# Patient Record
Sex: Male | Born: 1940 | Race: White | Hispanic: No | Marital: Married | Smoking: Former smoker
Health system: Southern US, Community
[De-identification: ages and names within clinical notes are randomized; demographics above are authoritative.]

## PROBLEM LIST (undated history)

## (undated) DIAGNOSIS — J90 Pleural effusion, not elsewhere classified: Secondary | ICD-10-CM

## (undated) DIAGNOSIS — G4730 Sleep apnea, unspecified: Secondary | ICD-10-CM

## (undated) DIAGNOSIS — E669 Obesity, unspecified: Secondary | ICD-10-CM

## (undated) DIAGNOSIS — E7800 Pure hypercholesterolemia, unspecified: Secondary | ICD-10-CM

## (undated) DIAGNOSIS — I10 Essential (primary) hypertension: Secondary | ICD-10-CM

## (undated) DIAGNOSIS — R400 Somnolence: Secondary | ICD-10-CM

## (undated) DIAGNOSIS — I639 Cerebral infarction, unspecified: Secondary | ICD-10-CM

## (undated) DIAGNOSIS — E079 Disorder of thyroid, unspecified: Secondary | ICD-10-CM

## (undated) DIAGNOSIS — G4700 Insomnia, unspecified: Secondary | ICD-10-CM

## (undated) DIAGNOSIS — I4891 Unspecified atrial fibrillation: Secondary | ICD-10-CM

## (undated) DIAGNOSIS — I517 Cardiomegaly: Secondary | ICD-10-CM

## (undated) DIAGNOSIS — J342 Deviated nasal septum: Secondary | ICD-10-CM

## (undated) DIAGNOSIS — I495 Sick sinus syndrome: Secondary | ICD-10-CM

## (undated) HISTORY — DX: Pure hypercholesterolemia, unspecified: E78.00

## (undated) HISTORY — DX: Sleep apnea, unspecified: G47.30

## (undated) HISTORY — DX: Unspecified atrial fibrillation: I48.91

## (undated) HISTORY — DX: Cerebral infarction, unspecified: I63.9

## (undated) HISTORY — DX: Essential (primary) hypertension: I10

## (undated) HISTORY — DX: Somnolence: R40.0

## (undated) HISTORY — DX: Disorder of thyroid, unspecified: E07.9

## (undated) HISTORY — DX: Insomnia, unspecified: G47.00

## (undated) HISTORY — DX: Obesity, unspecified: E66.9

## (undated) HISTORY — DX: Cardiomegaly: I51.7

## (undated) HISTORY — DX: Sick sinus syndrome: I49.5

## (undated) HISTORY — DX: Deviated nasal septum: J34.2

## (undated) HISTORY — PX: CAROTID ARTERY - SUBCLAVIAN ARTERY BYPASS GRAFT: SUR178

## (undated) HISTORY — PX: OTHER SURGICAL HISTORY: SHX169

## (undated) HISTORY — DX: Pleural effusion, not elsewhere classified: J90

---

## 2009-07-22 ENCOUNTER — Encounter: Payer: Self-pay | Admitting: Cardiovascular Disease

## 2009-07-26 ENCOUNTER — Encounter: Payer: Self-pay | Admitting: Cardiovascular Disease

## 2009-08-16 ENCOUNTER — Encounter: Payer: Self-pay | Admitting: Cardiovascular Disease

## 2009-08-23 ENCOUNTER — Encounter: Payer: Self-pay | Admitting: Cardiovascular Disease

## 2009-09-20 ENCOUNTER — Encounter: Payer: Self-pay | Admitting: Cardiovascular Disease

## 2009-09-22 ENCOUNTER — Encounter: Payer: Self-pay | Admitting: Cardiovascular Disease

## 2009-09-23 ENCOUNTER — Encounter: Payer: Self-pay | Admitting: Cardiovascular Disease

## 2009-09-27 ENCOUNTER — Encounter: Payer: Self-pay | Admitting: Cardiovascular Disease

## 2009-10-05 DIAGNOSIS — E7800 Pure hypercholesterolemia, unspecified: Secondary | ICD-10-CM

## 2009-10-05 DIAGNOSIS — R404 Transient alteration of awareness: Secondary | ICD-10-CM

## 2009-10-05 DIAGNOSIS — I10 Essential (primary) hypertension: Secondary | ICD-10-CM | POA: Insufficient documentation

## 2009-10-05 DIAGNOSIS — I6529 Occlusion and stenosis of unspecified carotid artery: Secondary | ICD-10-CM

## 2009-10-05 DIAGNOSIS — R5381 Other malaise: Secondary | ICD-10-CM

## 2009-10-05 DIAGNOSIS — G4700 Insomnia, unspecified: Secondary | ICD-10-CM

## 2009-10-05 DIAGNOSIS — R7989 Other specified abnormal findings of blood chemistry: Secondary | ICD-10-CM | POA: Insufficient documentation

## 2009-10-05 DIAGNOSIS — E119 Type 2 diabetes mellitus without complications: Secondary | ICD-10-CM | POA: Insufficient documentation

## 2009-10-05 DIAGNOSIS — I4891 Unspecified atrial fibrillation: Secondary | ICD-10-CM | POA: Insufficient documentation

## 2009-10-05 DIAGNOSIS — Z8679 Personal history of other diseases of the circulatory system: Secondary | ICD-10-CM | POA: Insufficient documentation

## 2009-10-05 DIAGNOSIS — R5383 Other fatigue: Secondary | ICD-10-CM

## 2009-10-05 DIAGNOSIS — E669 Obesity, unspecified: Secondary | ICD-10-CM | POA: Insufficient documentation

## 2009-10-06 ENCOUNTER — Ambulatory Visit: Payer: Self-pay | Admitting: Cardiovascular Disease

## 2009-10-06 DIAGNOSIS — R609 Edema, unspecified: Secondary | ICD-10-CM

## 2009-10-07 ENCOUNTER — Encounter: Payer: Self-pay | Admitting: Cardiovascular Disease

## 2009-10-10 ENCOUNTER — Encounter: Payer: Self-pay | Admitting: Cardiovascular Disease

## 2009-10-10 ENCOUNTER — Ambulatory Visit (HOSPITAL_COMMUNITY): Admission: RE | Admit: 2009-10-10 | Discharge: 2009-10-10 | Payer: Self-pay | Admitting: Cardiovascular Disease

## 2009-10-10 ENCOUNTER — Ambulatory Visit: Payer: Self-pay | Admitting: Cardiology

## 2009-10-10 ENCOUNTER — Ambulatory Visit: Payer: Self-pay

## 2009-10-12 ENCOUNTER — Ambulatory Visit (HOSPITAL_COMMUNITY): Admission: RE | Admit: 2009-10-12 | Discharge: 2009-10-12 | Payer: Self-pay | Admitting: Cardiovascular Disease

## 2009-10-12 ENCOUNTER — Ambulatory Visit: Payer: Self-pay | Admitting: Cardiovascular Disease

## 2009-10-18 ENCOUNTER — Encounter: Payer: Self-pay | Admitting: Cardiovascular Disease

## 2009-11-21 ENCOUNTER — Encounter: Payer: Self-pay | Admitting: Cardiovascular Disease

## 2009-11-21 ENCOUNTER — Encounter (HOSPITAL_COMMUNITY): Admission: RE | Admit: 2009-11-21 | Discharge: 2010-01-10 | Payer: Self-pay | Admitting: Cardiovascular Disease

## 2009-11-21 ENCOUNTER — Ambulatory Visit: Payer: Self-pay | Admitting: Cardiovascular Disease

## 2009-11-21 ENCOUNTER — Ambulatory Visit: Payer: Self-pay

## 2010-02-01 ENCOUNTER — Encounter: Payer: Self-pay | Admitting: Cardiovascular Disease

## 2010-02-01 ENCOUNTER — Ambulatory Visit: Payer: Self-pay | Admitting: Cardiovascular Disease

## 2010-02-01 DIAGNOSIS — R0602 Shortness of breath: Secondary | ICD-10-CM

## 2010-02-03 ENCOUNTER — Telehealth: Payer: Self-pay | Admitting: Cardiovascular Disease

## 2010-02-14 ENCOUNTER — Ambulatory Visit: Payer: Self-pay

## 2010-02-14 ENCOUNTER — Ambulatory Visit: Payer: Self-pay | Admitting: Cardiovascular Disease

## 2010-03-28 ENCOUNTER — Ambulatory Visit: Payer: Self-pay | Admitting: Cardiovascular Disease

## 2010-03-28 DIAGNOSIS — M542 Cervicalgia: Secondary | ICD-10-CM | POA: Insufficient documentation

## 2010-03-29 ENCOUNTER — Encounter: Payer: Self-pay | Admitting: Cardiovascular Disease

## 2010-03-30 ENCOUNTER — Telehealth (INDEPENDENT_AMBULATORY_CARE_PROVIDER_SITE_OTHER): Payer: Self-pay | Admitting: *Deleted

## 2010-04-11 ENCOUNTER — Ambulatory Visit: Payer: Self-pay | Admitting: Cardiovascular Disease

## 2010-04-12 ENCOUNTER — Ambulatory Visit: Payer: Self-pay | Admitting: Cardiology

## 2010-04-12 ENCOUNTER — Ambulatory Visit: Payer: Self-pay

## 2010-04-12 ENCOUNTER — Ambulatory Visit: Payer: Self-pay | Admitting: Cardiovascular Disease

## 2010-04-12 LAB — CONVERTED CEMR LAB
BUN: 27 mg/dL — ABNORMAL HIGH (ref 6–23)
Basophils Absolute: 0.1 10*3/uL (ref 0.0–0.1)
Basophils Relative: 0.9 % (ref 0.0–3.0)
CO2: 30 meq/L (ref 19–32)
Calcium: 9 mg/dL (ref 8.4–10.5)
Chloride: 102 meq/L (ref 96–112)
Creatinine, Ser: 1.4 mg/dL (ref 0.4–1.5)
Eosinophils Absolute: 0.2 10*3/uL (ref 0.0–0.7)
Eosinophils Relative: 2.5 % (ref 0.0–5.0)
GFR calc non Af Amer: 53.56 mL/min (ref >60)
Glucose, Bld: 153 mg/dL — ABNORMAL HIGH (ref 70–99)
HCT: 43 % (ref 39.0–52.0)
Hemoglobin: 15.1 g/dL (ref 13.0–17.0)
INR: 2.5 — ABNORMAL HIGH (ref 0.8–1.0)
Lymphocytes Relative: 13.7 % (ref 12.0–46.0)
Lymphs Abs: 1 10*3/uL (ref 0.7–4.0)
MCHC: 35.1 g/dL (ref 30.0–36.0)
MCV: 93.3 fL (ref 78.0–100.0)
Monocytes Absolute: 0.6 10*3/uL (ref 0.1–1.0)
Monocytes Relative: 8.3 % (ref 3.0–12.0)
Neutro Abs: 5.5 10*3/uL (ref 1.4–7.7)
Neutrophils Relative %: 74.6 % (ref 43.0–77.0)
Platelets: 219 10*3/uL (ref 150.0–400.0)
Potassium: 4.1 meq/L (ref 3.5–5.1)
Prothrombin Time: 27.1 s — ABNORMAL HIGH (ref 9.7–11.8)
RBC: 4.61 M/uL (ref 4.22–5.81)
RDW: 14.3 % (ref 11.5–14.6)
Sodium: 140 meq/L (ref 135–145)
WBC: 7.3 10*3/uL (ref 4.5–10.5)
aPTT: 38.8 s — ABNORMAL HIGH (ref 21.7–28.8)

## 2010-04-18 ENCOUNTER — Ambulatory Visit: Payer: Self-pay | Admitting: Cardiovascular Disease

## 2010-04-18 ENCOUNTER — Ambulatory Visit (HOSPITAL_COMMUNITY): Admission: RE | Admit: 2010-04-18 | Discharge: 2010-04-18 | Payer: Self-pay | Admitting: Cardiovascular Disease

## 2010-04-19 ENCOUNTER — Inpatient Hospital Stay (HOSPITAL_COMMUNITY): Admission: EM | Admit: 2010-04-19 | Discharge: 2010-04-22 | Payer: Self-pay | Admitting: Internal Medicine

## 2010-04-19 ENCOUNTER — Telehealth: Payer: Self-pay | Admitting: Cardiovascular Disease

## 2010-04-19 ENCOUNTER — Encounter: Payer: Self-pay | Admitting: Emergency Medicine

## 2010-04-19 ENCOUNTER — Ambulatory Visit: Payer: Self-pay | Admitting: Diagnostic Radiology

## 2010-04-21 ENCOUNTER — Ambulatory Visit: Payer: Self-pay | Admitting: Surgery

## 2010-04-21 ENCOUNTER — Ambulatory Visit: Payer: Self-pay | Admitting: Internal Medicine

## 2010-04-21 ENCOUNTER — Encounter (INDEPENDENT_AMBULATORY_CARE_PROVIDER_SITE_OTHER): Payer: Self-pay | Admitting: Internal Medicine

## 2010-04-25 ENCOUNTER — Ambulatory Visit (HOSPITAL_BASED_OUTPATIENT_CLINIC_OR_DEPARTMENT_OTHER): Admission: RE | Admit: 2010-04-25 | Discharge: 2010-04-25 | Payer: Self-pay | Admitting: Internal Medicine

## 2010-04-25 ENCOUNTER — Ambulatory Visit: Payer: Self-pay | Admitting: Adult Health

## 2010-04-25 ENCOUNTER — Ambulatory Visit: Payer: Self-pay | Admitting: Radiology

## 2010-04-25 DIAGNOSIS — J90 Pleural effusion, not elsewhere classified: Secondary | ICD-10-CM | POA: Insufficient documentation

## 2010-04-27 ENCOUNTER — Telehealth: Payer: Self-pay | Admitting: Cardiovascular Disease

## 2010-04-28 ENCOUNTER — Ambulatory Visit: Payer: Self-pay | Admitting: Internal Medicine

## 2010-05-03 ENCOUNTER — Ambulatory Visit: Payer: Self-pay | Admitting: Thoracic Surgery

## 2010-05-08 ENCOUNTER — Encounter: Payer: Self-pay | Admitting: Cardiovascular Disease

## 2010-05-09 ENCOUNTER — Ambulatory Visit: Payer: Self-pay | Admitting: Cardiovascular Disease

## 2010-05-10 ENCOUNTER — Telehealth: Payer: Self-pay | Admitting: Cardiovascular Disease

## 2010-05-15 ENCOUNTER — Encounter: Payer: Self-pay | Admitting: Thoracic Surgery

## 2010-05-15 ENCOUNTER — Inpatient Hospital Stay (HOSPITAL_COMMUNITY)
Admission: RE | Admit: 2010-05-15 | Discharge: 2010-05-19 | Payer: Self-pay | Source: Home / Self Care | Attending: Thoracic Surgery | Admitting: Thoracic Surgery

## 2010-05-22 ENCOUNTER — Ambulatory Visit: Payer: Self-pay | Admitting: Internal Medicine

## 2010-05-24 ENCOUNTER — Ambulatory Visit: Payer: Self-pay | Admitting: Thoracic Surgery

## 2010-05-24 ENCOUNTER — Encounter: Payer: Self-pay | Admitting: Cardiovascular Disease

## 2010-05-24 ENCOUNTER — Encounter
Admission: RE | Admit: 2010-05-24 | Discharge: 2010-05-24 | Payer: Self-pay | Source: Home / Self Care | Attending: Thoracic Surgery | Admitting: Thoracic Surgery

## 2010-05-24 ENCOUNTER — Telehealth: Payer: Self-pay | Admitting: Cardiovascular Disease

## 2010-05-30 ENCOUNTER — Ambulatory Visit: Payer: Self-pay | Admitting: Pulmonary Disease

## 2010-05-31 ENCOUNTER — Encounter: Payer: Self-pay | Admitting: Cardiovascular Disease

## 2010-05-31 ENCOUNTER — Telehealth: Payer: Self-pay | Admitting: Pulmonary Disease

## 2010-05-31 ENCOUNTER — Ambulatory Visit: Payer: Self-pay | Admitting: Cardiovascular Disease

## 2010-06-01 ENCOUNTER — Telehealth: Payer: Self-pay | Admitting: Cardiovascular Disease

## 2010-06-02 ENCOUNTER — Telehealth: Payer: Self-pay | Admitting: Cardiovascular Disease

## 2010-06-07 ENCOUNTER — Encounter
Admission: RE | Admit: 2010-06-07 | Discharge: 2010-06-07 | Payer: Self-pay | Source: Home / Self Care | Attending: Thoracic Surgery | Admitting: Thoracic Surgery

## 2010-06-07 ENCOUNTER — Ambulatory Visit: Payer: Self-pay | Admitting: Thoracic Surgery

## 2010-06-07 ENCOUNTER — Encounter: Payer: Self-pay | Admitting: Internal Medicine

## 2010-06-08 ENCOUNTER — Telehealth: Payer: Self-pay | Admitting: Cardiovascular Disease

## 2010-06-13 ENCOUNTER — Telehealth: Payer: Self-pay | Admitting: Cardiovascular Disease

## 2010-06-19 ENCOUNTER — Telehealth: Payer: Self-pay | Admitting: Cardiovascular Disease

## 2010-06-20 ENCOUNTER — Encounter: Payer: Self-pay | Admitting: Cardiovascular Disease

## 2010-06-22 ENCOUNTER — Ambulatory Visit
Admission: RE | Admit: 2010-06-22 | Discharge: 2010-06-22 | Payer: Self-pay | Source: Home / Self Care | Attending: Cardiovascular Disease | Admitting: Cardiovascular Disease

## 2010-07-03 ENCOUNTER — Telehealth: Payer: Self-pay | Admitting: Cardiovascular Disease

## 2010-07-04 ENCOUNTER — Ambulatory Visit
Admission: RE | Admit: 2010-07-04 | Discharge: 2010-07-04 | Payer: Self-pay | Source: Home / Self Care | Attending: Cardiovascular Disease | Admitting: Cardiovascular Disease

## 2010-07-04 ENCOUNTER — Encounter: Payer: Self-pay | Admitting: Physician Assistant

## 2010-07-10 ENCOUNTER — Encounter: Payer: Self-pay | Admitting: Cardiovascular Disease

## 2010-07-10 ENCOUNTER — Ambulatory Visit
Admission: RE | Admit: 2010-07-10 | Discharge: 2010-07-10 | Payer: Self-pay | Source: Home / Self Care | Attending: Internal Medicine | Admitting: Internal Medicine

## 2010-07-10 DIAGNOSIS — I495 Sick sinus syndrome: Secondary | ICD-10-CM | POA: Insufficient documentation

## 2010-07-11 NOTE — Progress Notes (Signed)
  Phone Note Outgoing Call   Call placed by: Deliah Goody, RN,  March 30, 2010 6:56 PM Summary of Call: pt called and instructed to not take glimipride the morning of the cardioversion and he will take 1/2 the dose of his evening lantus the night before. Deliah Goody, RN  March 30, 2010 6:57 PM

## 2010-07-11 NOTE — Consult Note (Signed)
Summary: [facility] Cardiology  [facility] Cardiology   Imported By: Marylou Mccoy 10/05/2009 13:20:30  _____________________________________________________________________  External Attachment:    Type:   Image     Comment:   External Document

## 2010-07-11 NOTE — Miscellaneous (Signed)
  Clinical Lists Changes  Observations: Added new observation of RESULTS MISC:                               CONSULTATION      CARDIOVERSION      INDICATION:  A 70 year old patient with atrial fibrillation.      The patient was on therapeutic Coumadin.  His INR was 2.3.  I reviewed   records from [facility], and he had been therapeutic for   over 4 weeks.      The patient was anesthetized with 150 mg of Diprivan.      Using a single 200-joule biphasic shock, the patient was converted from   atrial fibrillation at a rate of 90 to normal sinus rhythm with a rate   of 70.      IMPRESSION:  Successful cardioversion.  The patient had no immediate   neurological sequela.  He will continue on his Coumadin.  He has a   followup appointment with me on Oct 19, 2009.               Noralyn Pick. Eden Emms, MD, [facility]            PCN/MEDQ  D:  10/12/2009  T:  10/13/2009  Job:  [id number]   (10/12/2009 13:21)      MISC. Report  Procedure date:  10/12/2009  Findings:                                    CONSULTATION      CARDIOVERSION      INDICATION:  A 70 year old patient with atrial fibrillation.      The patient was on therapeutic Coumadin.  His INR was 2.3.  I reviewed   records from [facility], and he had been therapeutic for   over 4 weeks.      The patient was anesthetized with 150 mg of Diprivan.      Using a single 200-joule biphasic shock, the patient was converted from   atrial fibrillation at a rate of 90 to normal sinus rhythm with a rate   of 70.      IMPRESSION:  Successful cardioversion.  The patient had no immediate   neurological sequela.  He will continue on his Coumadin.  He has a   followup appointment with me on Oct 19, 2009.               Noralyn Pick. Eden Emms, MD, [facility]            PCN/MEDQ  D:  10/12/2009  T:  10/13/2009  Job:  [id number]

## 2010-07-11 NOTE — Letter (Signed)
Summary: [facility] Progress Note Report   [facility] Progress [facility] Report   Imported By: Roderic Ovens 11/23/2009 15:53:12  _____________________________________________________________________  External Attachment:    Type:   Image     Comment:   External Document

## 2010-07-11 NOTE — Consult Note (Signed)
Summary: [facility] Cardiology  [facility] Cardiology   Imported By: Marylou Mccoy 10/05/2009 13:00:04  _____________________________________________________________________  External Attachment:    Type:   Image     Comment:   External Document

## 2010-07-11 NOTE — Miscellaneous (Signed)
Summary:  CONSULTATION   Clinical Lists Changes  Observations: Added new observation of RESULTS MISC:                    CONSULTATION         This is a direct current cardioversion.      A 70-year-old patient, on flecainide, with previously failed   cardioversion, dose recently increased to 100 mg b.i.d.  Negative   treadmill for proarrhythmia.  The patient was in atrial fibrillation at   a heart rate of 80.  INR has been therapeutic for quite some time, most   recent INR on April 11, 2010, is 2.5.  The patient was anesthetized by   anesthesia with 100 mg of propofol.  A single 200-joule biphasic shock   was delivered.  The patient converted to normal sinus rhythm after a   long pause, had a rate of 52.      IMPRESSION:  Successful cardioversion.  The patient will follow up with   me in the office in 3-4 weeks.  He will continue on his Coumadin.  No   immediate neurological sequela.               Kenneth Jennings. Eden Emms, MD, [facility]               PCN/MEDQ  D:  04/18/2010  T:  04/19/2010  Job:  [id number]      Electronically Signed by Charlton Haws MD [facility] on 04/24/2010 11:06:40 AM   (04/19/2010 11:21) Added new observation of RESULTS MISC:                                 CONSULTATION      CARDIOVERSION      INDICATION:  A 70 year old patient with atrial fibrillation.      The patient was on therapeutic Coumadin.  His INR was 2.3.  I reviewed   records from [facility], and he had been therapeutic for   over 4 weeks.      The patient was anesthetized with 150 mg of Diprivan.      Using a single 200-joule biphasic shock, the patient was converted from   atrial fibrillation at a rate of 90 to normal sinus rhythm with a rate   of 70.      IMPRESSION:  Successful cardioversion.  The patient had no immediate   neurological sequela.  He will continue on his Coumadin.  He has a   followup appointment with me on Oct 19, 2009.               Kenneth Jennings. Eden Emms, MD, [facility]              PCN/MEDQ  D:  10/12/2009  T:  10/13/2009  Job:  [id number]      Electronically Signed by Charlton Haws MD [facility] on 11/01/2009 10:45:34 AM   (10/17/2009 11:22)      MISC. Report  Procedure date:  04/19/2010  Findings:                         CONSULTATION         This is a direct current cardioversion.      A 71-year-old patient, on flecainide, with previously failed   cardioversion, dose recently increased to 100 mg b.i.d.  Negative   treadmill for proarrhythmia.  The patient was  in atrial fibrillation at   a heart rate of 80.  INR has been therapeutic for quite some time, most   recent INR on April 11, 2010, is 2.5.  The patient was anesthetized by   anesthesia with 100 mg of propofol.  A single 200-joule biphasic shock   was delivered.  The patient converted to normal sinus rhythm after a   long pause, had a rate of 52.      IMPRESSION:  Successful cardioversion.  The patient will follow up with   me in the office in 3-4 weeks.  He will continue on his Coumadin.  No   immediate neurological sequela.               Kenneth Jennings. Eden Emms, MD, [facility]               PCN/MEDQ  D:  04/18/2010  T:  04/19/2010  Job:  [id number]      Electronically Signed by Charlton Haws MD [facility] on 04/24/2010 11:06:40 AM    MISC. Report  Procedure date:  10/17/2009  Findings:                                      CONSULTATION      CARDIOVERSION      INDICATION:  A 70 year old patient with atrial fibrillation.      The patient was on therapeutic Coumadin.  His INR was 2.3.  I reviewed   records from [facility], and he had been therapeutic for   over 4 weeks.      The patient was anesthetized with 150 mg of Diprivan.      Using a single 200-joule biphasic shock, the patient was converted from   atrial fibrillation at a rate of 90 to normal sinus rhythm with a rate   of 70.      IMPRESSION:  Successful cardioversion.  The patient had no immediate   neurological sequela.  He  will continue on his Coumadin.  He has a   followup appointment with me on Oct 19, 2009.               Kenneth Jennings. Eden Emms, MD, [facility]            PCN/MEDQ  D:  10/12/2009  T:  10/13/2009  Job:  [id number]      Electronically Signed by Charlton Haws MD [facility] on 11/01/2009 10:45:34 AM

## 2010-07-11 NOTE — Letter (Signed)
Summary: Cardioversion/TEE Instructions  Architectural technologist, Main Office  [address]   [place], [place] [address]   Phone: [phone]  Fax: [phone]    Cardioversion   You are scheduled for a Cardioversion  on ___May 4, 2011_ with Dr. Eden Emms.   Please arrive at the [facility] at _10:00____________ a.m. on the day of your procedure.  1)   DIET:  A)   Nothing to eat or drink after midnight except your medications with a sip of water.    2)   Come to the [place] office on ____(not needed)________________ for lab work. The lab at [facility] is open from 8:30 a.m. to 1:30 p.m. and 2:30 p.m. to 5:00 p.m. The lab at [facility] is open from 7:30 a.m. to 5:30 p.m. You do not have to be fasting.  3)   MAKE SURE YOU TAKE YOUR COUMADIN.  4)   A)   DO NOT TAKE these medications before your procedure:      Do not take digoxin, cardizem and glimepiride the morning of cardioversion ___________________________________________________________________     ___________________________________________________________________     ___________________________________________________________________  B)   YOU MAY TAKE ALL of your remaining medications with a small amount of water.    C)   START NEW medications:       ___________________________________________________________________     ___________________________________________________________________  5)  Must have a responsible person to drive you home.  6)   Bring a current list of your medications and current insurance cards.   * Special Note:  Every effort is made to have your procedure done on time. Occasionally there are emergencies that present themselves at the hospital that may cause delays. Please be patient if a delay does occur.  * If you have any questions after you get home, please call the office at [phone].

## 2010-07-11 NOTE — Consult Note (Signed)
Summary: [facility] Cardiology  [facility] Cardiology   Imported By: Marylou Mccoy 10/05/2009 12:57:36  _____________________________________________________________________  External Attachment:    Type:   Image     Comment:   External Document

## 2010-07-11 NOTE — Assessment & Plan Note (Signed)
Summary: rov/Jennings cardioversion/dm   Primary Mamoru Jennings:  Dr. Orrin Brigham   CC:  sob.  History of Present Illness: Kenneth Jennings is about 3 weeks Jennings [name].  He has maint NSR this time on higher dose Flecainide.  In the interum he has developed a recurrent left pleural effusion.  He is to have ? VATS with talc with Dr Edwyna Shell on Monday.  Although it is not ideal I think it is acceptable to hold his coumadin as he is in NSr and 3 weeks out from [facility].  He feels that the lasix is too strong but has been needed for his effusion.  We will need to follow him Jennings op as he is a risk for recurrent afib during the procedure and will need to restart lovenox and coumadin.  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  He should not be on both BB's and Cardizem.  I suspect he will need a pacer at some time in the future  Current Problems (verified): 1)  Pleural Effusion  (ICD-511.9) 2)  Neck Pain  (ICD-723.1) 3)  Encounter For Long-term Use of Other Medications  (ICD-V58.69) 4)  Dyspnea  (ICD-786.05) 5)  Edema  (ICD-782.3) 6)  Fatigue  (ICD-780.79) 7)  Hypertension  (ICD-401.9) 8)  Carotid Artery Disease  (ICD-433.10) 9)  Atrial Fibrillation  (ICD-427.31) 10)  Cerebrovascular Accident, Hx of  (ICD-V12.50) 11)  Obesity  (ICD-278.00) 12)  Diabetes Mellitus, Type II  (ICD-250.00) 13)  Somnolence  (ICD-780.09) 14)  Hypercholesterolemia  (ICD-272.0) 15)  Insomnia  (ICD-780.52) 16)  Other Abnormal Blood Chemistry  (ICD-790.6)  Current Medications (verified): 1)  Diltiazem Hcl Er Beads 120 Mg Xr24h-Cap (Diltiazem Hcl Er Beads) .... Take One Capsule By Mouth Daily 2)  Furosemide 40 Mg Tabs (Furosemide) .... 2 Tablets By Mouth Once A Day 3)  Warfarin Sodium 10 Mg Tabs (Warfarin Sodium) .... As Directed 4)  Glimepiride 4 Mg Tabs (Glimepiride) .Marland Kitchen.. 1 Tab By Mouth Two Times A Day 5)  Lantus 100 Unit/ml Soln (Insulin Glargine) .... 30 Units Daily 6)  Flecainide Acetate 100 Mg Tabs (Flecainide Acetate) .Marland Kitchen.. 1 Two Times A Day 7)   Levothyroxine Sodium 25 Mcg Tabs (Levothyroxine Sodium) .... Take 1 Tablet By Mouth Every Morning 8)  Zolpidem Tartrate 10 Mg Tabs (Zolpidem Tartrate) .Marland Kitchen.. 1  Tab By Mouth At Bedtime  Allergies (verified): No Known Drug Allergies  Past History:  Past Medical History: Last updated: 10/05/2009 Current Problems:  FATIGUE (ICD-780.79) HYPERTENSION (ICD-401.9) CAROTID ARTERY DISEASE (ICD-433.10) ATRIAL FIBRILLATION (ICD-427.31) CEREBROVASCULAR ACCIDENT, HX OF (ICD-V12.50) OBESITY (ICD-278.00) DIABETES MELLITUS, TYPE II (ICD-250.00) SOMNOLENCE (ICD-780.09) HYPERCHOLESTEROLEMIA (ICD-272.0) INSOMNIA (ICD-780.52) OTHER ABNORMAL BLOOD CHEMISTRY (ICD-790.6)  Past Surgical History: Last updated: 10/05/2009 Carotid surgery  Family History: Last updated: 10/05/2009 cad  Social History: Last updated: 10/06/2009 Tobacco Use - No.  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   Vital Signs:  Patient profile:   70 year old male Height:      72 inches Weight:      259 pounds BMI:     35.25 Pulse rate:   58 / minute Resp:     14 per minute BP  sitting:   130 / 80  (left arm)  Vitals Entered By: Kem Parkinson (May 09, 2010 3:04 PM)  Physical Exam  General:  Affect appropriate Healthy:  appears stated age HEENT: normal Neck supple with no adenopathy JVP normal left bruits no thyromegaly Lungs decreased breath sounds lef mid and base consistant with effusion with no wheezing and good diaphragmatic motion Heart:  S1/S2 systolic  murmur,rub, gallop or click PMI normal Abdomen: benighn, BS positve, no  tenderness, no AAA no bruit.  No HSM or HJR Distal pulses intact with no bruits No edema Neuro non-focal Skin warm and dry    Impression & Recommendations:  Problem # 1:  PLEURAL EFFUSION (ICD-511.9) For VATS and scleroRx with Dr Edwyna Shell on Monday.    Problem # 2:  HYPERTENSION (ICD-401.9) Well contorlled The following medications were removed from the medication list:    Metoprolol Tartrate 50 Mg Tabs (Metoprolol tartrate) .Marland Kitchen... Take 1/2 tablet by mouth x5days, then stop as directed by dr. Eden Emms His updated medication list for this problem includes:    Diltiazem Hcl Er Beads 120 Mg Xr24h-cap (Diltiazem hcl er beads) .Marland Kitchen... Take one capsule by mouth daily    Furosemide 40 Mg Tabs (Furosemide) .Marland Kitchen... 2 tablets by mouth once a day  Problem # 3:  CAROTID ARTERY DISEASE (ICD-433.10) F/U duplex in 6 months His updated medication list for this problem includes:    Warfarin Sodium 10 Mg Tabs (Warfarin sodium) .Marland Kitchen... As directed  Problem # 4:  ATRIAL FIBRILLATION (ICD-427.31) S/P DCC x2 maint NSR Continue Flecainide The following medications were removed from the medication list:    Metoprolol Tartrate 50 Mg Tabs (Metoprolol tartrate) .Marland Kitchen... Take 1/2 tablet by mouth x5days, then stop as directed by dr. Eden Emms His updated medication list for this problem includes:    Warfarin Sodium 10 Mg Tabs (Warfarin sodium) .Marland Kitchen... As directed    Flecainide Acetate 100 Mg Tabs (Flecainide acetate) .Marland Kitchen... 1 two times a day  Patient Instructions: 1)  Your physician recommends that you schedule a follow-up appointment in: 3 MONTHS

## 2010-07-11 NOTE — Assessment & Plan Note (Signed)
Summary: 2 month rov/sl   Primary Provider:  Dr. Orrin Brigham   History of Present Illness: Kenneth Jennings returns today for F/U of anticoagulation, dyspnea, afib and hypertension.  He failed [facility] in early may.  He has no CAD, an nonischemic myovue normal EF by echo.  We wanted to start him on Flecainide with an eye toward repeat [facility] but he had a trip to [place] and then got pneumonia.  He has been on the Flecainide for a few weeks now with no proarythmia on ETT.    No fever, palpitations, SSCP or syncope.  We will increase his Flecainide to 100 two times a day and F/U ETT next week.  QRS today was 104 msec with QT 382.  He will F/U with ETT next week and repeat [facility] 11/8  Current Problems (verified): 1)  Dyspnea  (ICD-786.05) 2)  Edema  (ICD-782.3) 3)  Fatigue  (ICD-780.79) 4)  Hypertension  (ICD-401.9) 5)  Carotid Artery Disease  (ICD-433.10) 6)  Atrial Fibrillation  (ICD-427.31) 7)  Cerebrovascular Accident, Hx of  (ICD-V12.50) 8)  Obesity  (ICD-278.00) 9)  Diabetes Mellitus, Type II  (ICD-250.00) 10)  Somnolence  (ICD-780.09) 11)  Hypercholesterolemia  (ICD-272.0) 12)  Insomnia  (ICD-780.52) 13)  Other Abnormal Blood Chemistry  (ICD-790.6)  Current Medications (verified): 1)  Diltiazem Hcl Er Beads 120 Mg Xr24h-Cap (Diltiazem Hcl Er Beads) .... Take One Capsule By Mouth Daily 2)  Furosemide 20 Mg Tabs (Furosemide) .... Take One Tablet By Mouth Daily. 3)  Warfarin Sodium 10 Mg Tabs (Warfarin Sodium) .... As Directed 4)  Glimepiride 4 Mg Tabs (Glimepiride) .Marland Kitchen.. 1 Tab By Mouth Two Times A Day 5)  Lantus 100 Unit/ml Soln (Insulin Glargine) .... 30 Units Daily 6)  Flecainide Acetate 50 Mg Tabs (Flecainide Acetate) .Marland Kitchen.. 1 Tab By Mouth Two Times A Day  Allergies (verified): No Known Drug Allergies  Past History:  Past Medical History: Last updated: 10/05/2009 Current Problems:  FATIGUE (ICD-780.79) HYPERTENSION (ICD-401.9) CAROTID ARTERY DISEASE (ICD-433.10) ATRIAL FIBRILLATION  (ICD-427.31) CEREBROVASCULAR ACCIDENT, HX OF (ICD-V12.50) OBESITY (ICD-278.00) DIABETES MELLITUS, TYPE II (ICD-250.00) SOMNOLENCE (ICD-780.09) HYPERCHOLESTEROLEMIA (ICD-272.0) INSOMNIA (ICD-780.52) OTHER ABNORMAL BLOOD CHEMISTRY (ICD-790.6)  Past Surgical History: Last updated: 10/05/2009 Carotid surgery  Family History: Last updated: 10/05/2009 cad  Social History: Last updated: 10/06/2009 Tobacco Use - No.  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   Vital Signs:  Patient profile:   70 year old male Height:      72 inches Weight:      263 pounds BMI:     35.80 Pulse rate:   92 / minute Resp:     14 per minute BP sitting:   142 / 80  (left arm)  Vitals Entered By: Kem Parkinson (March 28, 2010 11:58 AM)  Physical Exam  General:  Affect appropriate Healthy:  appears stated age HEENT: normal Neck supple with no adenopathy JVP normal no bruits no thyromegaly Lungs clear with no wheezing and good diaphragmatic motion Heart:  S1/S2 no murmur,rub, gallop or click PMI normal Abdomen: benighn, BS positve, no tenderness, no AAA no bruit.  No HSM or HJR Distal pulses intact with no  bruits No edema Neuro non-focal Skin warm and dry ? Lipoma left supraclaviclar area   Impression & Recommendations:  Problem # 1:  HYPERTENSION (ICD-401.9)  Well controlled The following medications were removed from the medication list:    Metoprolol Tartrate 50 Mg Tabs (Metoprolol tartrate) .Marland Kitchen... Take one tablet by mouth twice a day His updated medication list for this problem includes:    Diltiazem Hcl Er  Beads 120 Mg Xr24h-cap (Diltiazem hcl er beads) .Marland Kitchen... Take one capsule by mouth daily    Furosemide 20 Mg Tabs (Furosemide) .Marland Kitchen... Take one tablet by mouth daily.  The following medications were removed from the medication list:    Metoprolol Tartrate 50 Mg Tabs (Metoprolol tartrate) .Marland Kitchen... Take one tablet by mouth twice a day His updated medication list for this problem includes:    Diltiazem Hcl Er Beads 120 Mg Xr24h-cap (Diltiazem hcl er beads) .Marland Kitchen... Take one capsule by mouth daily    Furosemide 20 Mg Tabs (Furosemide) .Marland Kitchen... Take one tablet by mouth daily.  Problem # 2:  CAROTID ARTERY DISEASE (ICD-433.10)  F/U duplex in 6 months S/P left CEA His updated medication list for this problem includes:    Warfarin Sodium 10 Mg Tabs (Warfarin sodium) .Marland Kitchen... As directed  His updated medication list for this problem includes:    Warfarin Sodium 10 Mg Tabs (Warfarin sodium) .Marland Kitchen... As directed  Problem # 3:  ATRIAL FIBRILLATION (ICD-427.31) Increase Flecainide with ETT next week and repeat [facility] on antiarrythmic 11/8  Continue coumadin The following medications were removed from the medication list:    Metoprolol Tartrate 50 Mg Tabs (Metoprolol tartrate) .Marland Kitchen... Take one tablet by mouth twice a day His updated medication list for this problem includes:    Warfarin Sodium 10 Mg Tabs (Warfarin sodium) .Marland Kitchen... As directed    Flecainide Acetate 100 Mg Tabs (Flecainide acetate) .Marland Kitchen... 1 two times a day  Problem # 4:  HYPERCHOLESTEROLEMIA (ICD-272.0) Continue statin give vascular disease F/U labs in 6 months  Other Orders: EKG w/ Interpretation (93000) CT Scan  (CT Scan) Treadmill (Treadmill)  Patient Instructions: 1)  Your physician recommends that you schedule a follow-up appointment in: AFTER CARDIOVERSION 2)  Your physician has requested that you have an exercise tolerance test.  For further information please visit [email or web address]  Please also follow instruction sheet, as given.04/12/10 3)  Your  physician has recommended you make the following change in your medication: INCREASE FLECAINDE T0 100 MG two times a day  4)  Non-Cardiac CT scanning, (CAT scanning), is a noninvasive, special x-ray that produces cross-sectional images of the body using x-rays and a computer. CT scans help physicians diagnose and treat medical conditions. For some CT exams, a contrast material is used to enhance visibility in the area of the body being studied. CT scans provide greater clarity and reveal more details than regular x-ray exams. 5)  Your physician has recommended that you have a cardioversion (DCCV).  Electrical cardioversion uses a jolt of electricity to your heart either through paddles or wired patches attached to your chest. This is a controlled, usually prescheduled, procedure. Defibrillation is done under light anesthesia in the hospital, and you usually go home the day of the procedure. This is done to get your heart back into a normal rhythm. You are not awake for the procedure. Please see the instruction sheet given to you today. 6)  04/18/10 7)  NEEDS COUMADIN CLINIC APPT 04/17/10 FOR INR Prescriptions: FLECAINIDE ACETATE 100 MG TABS (FLECAINIDE ACETATE) 1 two  times a day  #60 x 6   Entered by:   Scherrie Bateman, LPN   Authorized by:   Colon Branch, MD, [facility]   Signed by:   Scherrie Bateman, LPN on 16/03/9603   Method used:   Electronically to        [facility]. [phone]* (retail)       [address]       [place], [place]  [address]       Ph: [phone]       Fax: [phone]   RxID:   [id number]    EKG Report  Procedure date:  03/28/2010  Findings:      AFib 87 Otherwise normal

## 2010-07-11 NOTE — Letter (Signed)
Summary: [facility] Progress Note Report   [facility] Progress Note Report   Imported By: Roderic Ovens 11/23/2009 15:53:38  _____________________________________________________________________  External Attachment:    Type:   Image     Comment:   External Document

## 2010-07-11 NOTE — Progress Notes (Signed)
Summary: question on meds.  Phone Note Call from Patient Call back at Home Phone [phone]   Caller: Patient Reason for Call: Talk to Nurse Summary of Call: question on meds. pt has sob when he starts to fall asleep.  Initial call taken by: Roe Coombs,  April 19, 2010 2:30 PM  Follow-up for Phone Call        spoke with pt, he reports SOB when he tries to rest. he sleeps on 2 pillows but last night and today he has been unable to rest due to SOB. he denies SOB with routine activities and denies edema. he states this is the way he felt when he had pneumonia. he thinks he may have pneumonia again. instructed pt if he needed to see someone today he should report to the er, if not he should call dr Valda Favia, his primary care md tomorrow. pt given directions to the med[facility] in [place] Deliah Goody, RN  April 19, 2010 5:36 PM

## 2010-07-11 NOTE — Progress Notes (Signed)
Summary: blood pressure  Phone Note Call from Patient Call back at Home Phone [phone]   Reason for Call: Talk to Nurse Summary of Call: blood pressure 205/102 Initial call taken by: Roe Coombs,  May 10, 2010 1:31 PM  Follow-up for Phone Call        spoke with pt, he checked his bp and it is reading high. he feels fine and is getting ready to go to the [place] for a regular appt. he will cont to monitor and let me know if bp is consistantly running high Deliah Goody, RN  May 10, 2010 1:41 PM

## 2010-07-11 NOTE — Progress Notes (Signed)
Summary: question re med  Phone Note Call from Patient   Caller: Patient Reason for Call: Talk to Nurse Summary of Call: pt has question re med-pls call [phone] Initial call taken by: Glynda Jaeger,  February 03, 2010 2:50 PM  Follow-up for Phone Call        I spoke with the pt and he said he was taking Flecainide 100mg  in 2009 and he had an episode where his BP went in the 90's and he broke out in a sweat and went to the hospital.  The pt wanted to know if he would have the same episode when he started Flecainide tomorrow.  I made the pt aware that he will be taking a lower dose of Flecainide and he can monitor his BP at home.  I instructed the pt to call our on-call staff if he had any problems over the weekend.  Pt agreed with plan.  Follow-up by: Julieta Gutting, RN, BSN,  February 03, 2010 3:06 PM

## 2010-07-11 NOTE — Letter (Signed)
Summary: Cardioversion/TEE Instructions  Architectural technologist, Main Office  [address]   [place], [place] [address]   Phone: [phone]  Fax: [phone]    Cardioversion  04/12/2010 MRN: [id number]  Kenneth Jennings [address] [place], [place]  [address]  Dear Mr. Wallander, You are scheduled for a Cardioversion on ____________11/8/11________________ with Dr. _________NISHAN________________________________.   Please arrive at the [facility] at _______11:00______ a.m. / p.m. on the day of your procedure.  1)   DIET:      May have clear liquid breakfast, then nothing to eat or drink after ______7:00___ a.m.      Clear liquids include:  water, broth, Sprite, Ginger Ale, black coffee, tea (no sugar),      cranberry / grape / apple juice, jello (not red), popsicle from clear juices (not red).  2)   Come to the [place] office on _____11/2/11_______________ for lab work. The lab at [facility] is open from 8:30 a.m. to 1:30 p.m. and 2:30 p.m. to 5:00 p.m. The lab at [facility] is open from 7:30 a.m. to 5:30 p.m. You do not have to be fasting.  3)   MAKE SURE YOU TAKE YOUR COUMADIN.  4)   A)   DO NOT TAKE these medications before your procedure:      ___________________________________________________________________     ___________________________________________________________________     ___________________________________________________________________  B)   YOU MAY TAKE ALL of your remaining medications with a small amount of water.    C)   START NEW medications:       ___________________________________________________________________     ___________________________________________________________________  5)  Must have a responsible person to drive you home.  6)   Bring a current list of your medications and current insurance cards.   * Special Note:  Every effort is made to have your procedure done on  time. Occasionally there are emergencies that present themselves at the hospital that may cause delays. Please be patient if a delay does occur.  * If you have any questions after you get home, please call the office at [phone].

## 2010-07-11 NOTE — Assessment & Plan Note (Signed)
Summary: np3/hyperlipidemia/family hxof cad/htn   CC:  referal from [facility] medical  center.  History of Present Illness: Anthoney is seen today at the request of Dr Konrad Felix and Larina Earthly.  He has a history of afib and has been on coumadin for years.  Apparantly he went into afib in [place].  He needs to have a [facility]  I reviewed INR's from [facility] and he has been Rx since at least March.  He has no documented CAD with negative cath in 92.  He does have vascular disease with previous left CEA.  He denis SSCP, or dyspnea but since being in afib he has had more edema and fatigue.  I don't have any records of what his EF is.  The risks of [facility] including stroke were discussed.  He is anxous to proceed.  At some point he will need a F/U duplex of the CEA and if we are not succesful with [facility] and antiarrythmics are needed he will need to have CAD R/O with myovue.  He has been compliant with his meds and improved rate control on cardizem and digoxen.  He is on two diuretics but still has edema.  Current Problems (verified): 1)  Fatigue  (ICD-780.79) 2)  Hypertension  (ICD-401.9) 3)  Carotid Artery Disease  (ICD-433.10) 4)  Atrial Fibrillation  (ICD-427.31) 5)  Cerebrovascular Accident, Hx of  (ICD-V12.50) 6)  Obesity  (ICD-278.00) 7)  Diabetes Mellitus, Type II  (ICD-250.00) 8)  Somnolence  (ICD-780.09) 9)  Hypercholesterolemia  (ICD-272.0) 10)  Insomnia  (ICD-780.52) 11)  Other Abnormal Blood Chemistry  (ICD-790.6)  Current Medications (verified): 1)  Hydrochlorothiazide 12.5 Mg Tabs (Hydrochlorothiazide) .... Take One Tablet By Mouth Daily. 2)  Digoxin 0.25 Mg Tabs (Digoxin) .... Take One Tablet By Mouth Daily 3)  Dilt-Cd 300 Mg Xr24h-Cap (Diltiazem Hcl Coated Beads) .Marland Kitchen.. 1 Tab By Mouth Once Daily 4)  Diovan 160 Mg Tabs (Valsartan) .Marland Kitchen.. 1 Tab By Mouth Two Times A Day 5)  Furosemide 20 Mg Tabs (Furosemide) .... Take One Tablet By Mouth Daily. 6)  Warfarin Sodium 10 Mg Tabs (Warfarin Sodium) .... As  Directed 7)  Glimepiride 4 Mg Tabs (Glimepiride) .Marland Kitchen.. 1 Tab By Mouth Two Times A Day  Allergies (verified): No Known Drug Allergies  Past History:  Past Medical History: Last updated: 10/05/2009 Current Problems:  FATIGUE (ICD-780.79) HYPERTENSION (ICD-401.9) CAROTID ARTERY DISEASE (ICD-433.10) ATRIAL FIBRILLATION (ICD-427.31) CEREBROVASCULAR ACCIDENT, HX OF (ICD-V12.50) OBESITY (ICD-278.00) DIABETES MELLITUS, TYPE II (ICD-250.00) SOMNOLENCE (ICD-780.09) HYPERCHOLESTEROLEMIA (ICD-272.0) INSOMNIA (ICD-780.52) OTHER ABNORMAL BLOOD CHEMISTRY (ICD-790.6)  Past Surgical History: Last updated: 10/05/2009 Carotid surgery  Family History: Last updated: 10/05/2009 cad  Social History: Tobacco Use - No.  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 as his son is an Immunologist and his wife commited suicide Likes to golf and watch [place] sports  Vital Signs:  Patient profile:   70 year old male Height:      73 inches Weight:      259 pounds BMI:     34.29 Pulse rate:   83 / minute Resp:     14 per minute BP sitting:   134 / 72  (left arm)  Vitals Entered By: Kem Parkinson (October 06, 2009 3:00 PM)  Physical Exam  General:  Affect appropriate Healthy:  appears stated age HEENT: normal Neck supple with no adenopathy JVP normal left CEA with residual  bruits no thyromegaly Lungs clear with no wheezing and good diaphragmatic motion Heart:  S1/S2 no murmur,rub, gallop or click PMI normal Abdomen: benighn, BS positve, no tenderness, no AAA no bruit.  No HSM or HJR Distal pulses intact with no bruits Plus one to two bilateral edema Neuro non-focal Skin warm and dry    Impression & Recommendations:  Problem # 1:  ATRIAL FIBRILLATION (ICD-427.31)  INR today was  2.0  Will take 10mg  everyday until [facility]   [facility] set for 5.4.  hold dig and diltiazem morning of DCC  Check echo for EF and atrial size His updated medication list for this  problem includes:    Digoxin 0.25 Mg Tabs (Digoxin) .Marland Kitchen... Take one tablet by mouth daily    Warfarin Sodium 10 Mg Tabs (Warfarin sodium) .Marland Kitchen... As directed  Orders: Echocardiogram (Echo)  Problem # 2:  HYPERTENSION (ICD-401.9) Well controlled.  Calcium blocker may be contributing to edema His updated medication list for this problem includes:    Hydrochlorothiazide 12.5 Mg Tabs (Hydrochlorothiazide) .Marland Kitchen... Take one tablet by mouth daily.    Dilt-cd 300 Mg Xr24h-cap (Diltiazem hcl coated beads) .Marland Kitchen... 1 tab by mouth once daily    Diovan 160 Mg Tabs (Valsartan) .Marland Kitchen... 1 tab by mouth two times a day    Furosemide 20 Mg Tabs (Furosemide) .Marland Kitchen... Take one tablet by mouth daily.  Problem # 3:  CAROTID ARTERY DISEASE (ICD-433.10) F/U duplex post [facility] for previous CEA and residual bruit His updated medication list for this problem includes:    Warfarin Sodium 10 Mg Tabs (Warfarin sodium) .Marland Kitchen... As directed  Problem # 4:  EDEMA (ICD-782.3) Continue both diuretics for now.  If it persists post [facility] may need to change to zaroxyln or aldactone  Patient Instructions: 1)  Your physician recommends that you schedule a follow-up appointment in: 2 weeks--week after cardioversion 2)  Your physician has requested that you have an echocardiogram.  Echocardiography is a painless test that uses sound waves to create images of your heart. It provides your doctor with information about the size and shape of your heart and how well your heart's chambers and valves are working.  This procedure takes approximately one hour. There are no restrictions for this procedure. 3)  Your physician has recommended that you have a cardioversion (DCCV).  Electrical cardioversion uses a jolt of electricity to your heart either through paddles or wired patches attached to your chest. This is a controlled, usually prescheduled, procedure. Defibrillation is done under light anesthesia in the hospital, and you usually go home the day of the  procedure. This is done to get your heart back into a normal rhythm. You are not awake for the procedure. Please see the instruction sheet given to you today.

## 2010-07-11 NOTE — Assessment & Plan Note (Signed)
Summary: Cardiology Nuclear Study  Nuclear Med Background Indications for Stress Test: Evaluation for Ischemia, Post Hospital  Indications Comments: 10/12/09 Cardioversion- A.Fib  History: Echo, Heart Catheterization  History Comments: '92 Heart Cath: NL 10/10/09 Echo: EF=65%  Symptoms: Fatigue, Palpitations, SOB    Nuclear Pre-Procedure Cardiac Risk Factors: Carotid Disease, CVA, Family History - CAD, Hypertension, IDDM Type 2, Lipids Caffeine/Decaff Intake: none NPO After: 7:30 AM Lungs: clear IV 0.9% NS with Angio Cath: 18g     IV Site: (R) AC IV Started by: Stanton Kidney EMT-P Chest Size (in) 46     Height (in): 72 Weight (lb): 253 BMI: 34.44 Tech Comments: Metoprolol last tken @ 7:30 am this day, per Patient.  Nuclear Med Study 1 or 2 day study:  1 day     Stress Test Type:  Eugenie Birks Reading MD:  Charlton Haws, MD     Referring MD:  P.Nishan Resting Radionuclide:  Technetium 26m Tetrofosmin     Resting Radionuclide Dose:  11 mCi  Stress Radionuclide:  Technetium 29m Tetrofosmin     Stress Radionuclide Dose:  33 mCi   Stress Protocol   Lexiscan: 0.4 mg   Stress Test Technologist:  Milana Na EMT-P     Nuclear Technologist:  Domenic Polite CNMT  Rest Procedure  Myocardial perfusion imaging was performed at rest 45 minutes following the intravenous administration of Myoview Technetium 2m Tetrofosmin.  Stress Procedure  The patient received IV Lexiscan 0.4 mg over 15-seconds.  Myoview injected at 30-seconds.  There were no significant changes with infusion.  Quantitative spect images were obtained after a 45 minute delay.  QPS Raw Data Images:  Normal; no motion artifact; normal heart/lung ratio. Stress Images:  NI: Uniform and normal uptake of tracer in all myocardial segments. Rest Images:  Normal homogeneous uptake in all areas of the myocardium. Subtraction (SDS):  Normal Transient Ischemic Dilatation:  1.02  (Normal <1.22)  Lung/Heart Ratio:  .3  (Normal  <0.45)  Quantitative Gated Spect Images QGS cine images:  non-gated study  Findings Normal nuclear study      Overall Impression  Exercise Capacity: Lexiscan BP Response: Normal blood pressure response. Clinical Symptoms: Dysnpne ECG Impression: Afib Overall Impression: Normal stress nuclear study.  Appended Document: Cardiology Nuclear Study normal nuclear ok to start Flecainide on return from [place].  I spoke with him already.  Needs ETT 10-14 days after starting flecainide  Appended Document: Cardiology Nuclear Study spoke with pt, he developed pneumonia while on vacation and is currently on antibiotics. he does not want to start the flecainide prior to seeing dr Eden Emms on july 15th.
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
## 2010-07-11 NOTE — Assessment & Plan Note (Signed)
Summary: NP follow up - pleural effusion   Primary Provider/Referring Provider:  Dr. Orrin Brigham   CC:  2 day follow up - states breathing has slightly improved since last ov. .  History of Present Illness:  04/25/10--Presents for a post hospital visit. Admitted 04/19/2010-  11/12/2011for a  Left pleural effusion due to exudative effusion, etiology unclear. . Right upper lobe ground glass nodule on CT scan.  That will need  close  followup.  VQ scan with intermediate probability for pulmonary emboli, felt to  be clinically less likely pulmonary emboli. Atrial fibrillation, chronic Coumadin. He underwent  thoracentesis, and the diagnostic fluid revealed an exudate.  w/ 330cc removed. Felt to be parapneumonic w/ cytology neg malignant cellls. Labs revealed a nml BNP, cardiac enzymes. New dx of hypothroid w/ tsh at 5.0, started on synthroid. His serum albumin was nml at 3.9. Previous echo this year 5/20111 showed  LA mod/severe dilated. MIld LVH, EF 65%, Presents today for persistent dyspnea and orthopnea. Wt is 11lbs since 1 month ago, more leg swelling bilateally, Wears out easily, gets very winded and dyspnea is worse at night. Unable to sleep totally reclined. Sleeps on 3 pillows.  CXR today shows increased effusion on left since thoratesis. He was discharged on doxcycline.   April 28, 2010 --Presents for a 2 day follow up - He states breathing has slightly improved . He is sleeping better w/ less dyspnea and orthopnea. CXR todayw/ out signifcant change. He has been set up for a referral to Dr. Edwyna Shell to look at options for recurrent effusion next week. Denies chest pain,   hemoptysis, fever, n/v/d, headache.   Medications Prior to Update: 1)  Diltiazem Hcl Er Beads 120 Mg Xr24h-Cap (Diltiazem Hcl Er Beads) .... Take One Capsule By Mouth Daily 2)  Furosemide 40 Mg Tabs (Furosemide) .... Take 1 -2 Tablet By Mouth Once A Day 3)  Warfarin Sodium 10 Mg Tabs (Warfarin Sodium) .... As Directed 4)   Glimepiride 4 Mg Tabs (Glimepiride) .Marland Kitchen.. 1 Tab By Mouth Two Times A Day 5)  Lantus 100 Unit/ml Soln (Insulin Glargine) .... 30 Units Daily 6)  Flecainide Acetate 100 Mg Tabs (Flecainide Acetate) .Marland Kitchen.. 1 Two Times A Day 7)  Doxycycline Hyclate 100 Mg Caps (Doxycycline Hyclate) .... Take 1 Tablet By Mouth Every 12 Hours 8)  Levothyroxine Sodium 25 Mcg Tabs (Levothyroxine Sodium) .... Take 1 Tablet By Mouth Every Morning 9)  Metoprolol Tartrate 50 Mg Tabs (Metoprolol Tartrate) .... Take 1 Tablet By Mouth Two Times A Day 10)  Celebrex 100 Mg Caps (Celecoxib) .... Take 1 Tablet By Mouth Once A Day  Current Medications (verified): 1)  Diltiazem Hcl Er Beads 120 Mg Xr24h-Cap (Diltiazem Hcl Er Beads) .... Take One Capsule By Mouth Daily 2)  Furosemide 40 Mg Tabs (Furosemide) .... Take 1 -2 Tablet By Mouth Once A Day 3)  Warfarin Sodium 10 Mg Tabs (Warfarin Sodium) .... As Directed 4)  Glimepiride 4 Mg Tabs (Glimepiride) .Marland Kitchen.. 1 Tab By Mouth Two Times A Day 5)  Lantus 100 Unit/ml Soln (Insulin Glargine) .... 30 Units Daily 6)  Flecainide Acetate 100 Mg Tabs (Flecainide Acetate) .Marland Kitchen.. 1 Two Times A Day 7)  Levothyroxine Sodium 25 Mcg Tabs (Levothyroxine Sodium) .... Take 1 Tablet By Mouth Every Morning 8)  Metoprolol Tartrate 50 Mg Tabs (Metoprolol Tartrate) .... Take 1/2 Tablet By Mouth X5days, Then Stop As Directed By Dr. Eden Emms 9)  Celebrex 100 Mg Caps (Celecoxib) .... Take 1 Tablet By Mouth Once A  Day  Allergies (verified): No Known Drug Allergies  Past History:  Past Medical History: Last updated: 10/05/2009 Current Problems:  FATIGUE (ICD-780.79) HYPERTENSION (ICD-401.9) CAROTID ARTERY DISEASE (ICD-433.10) ATRIAL FIBRILLATION (ICD-427.31) CEREBROVASCULAR ACCIDENT, HX OF (ICD-V12.50) OBESITY (ICD-278.00) DIABETES MELLITUS, TYPE II (ICD-250.00) SOMNOLENCE (ICD-780.09) HYPERCHOLESTEROLEMIA (ICD-272.0) INSOMNIA (ICD-780.52) OTHER ABNORMAL BLOOD CHEMISTRY (ICD-790.6)  Past Surgical  History: Last updated: 10/05/2009 Carotid surgery  Family History: Last updated: 10/05/2009 cad  Social History: Last updated: 10/06/2009 Tobacco Use - No.  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 as his son is an Immunologist and his wife commited suicide Likes to golf and watch [place] sports  Risk Factors: Smoking Status: quit (04/25/2010) Packs/Day: 0.75 (04/25/2010)  Review of Systems      See HPI  Vital Signs:  Patient profile:   70 year old male Height:      72 inches Weight:      268 pounds BMI:     36.48 O2 Sat:      94 % on Room air Temp:     97.9 degrees F oral Pulse rate:   42 / minute BP sitting:   126 / 84  (left arm) Cuff size:   large  Vitals Entered By: Boone Master CNA/MA (April 28, 2010 11:42 AM)  O2 Flow:  Room air CC: 2 day follow up - states breathing has slightly improved since last ov.  Is Patient Diabetic? Yes Comments Medications reviewed with patient Daytime contact number verified with patient. Boone Master CNA/MA  April 28, 2010 11:42 AM    Physical Exam  Additional Exam:  GEN: A/Ox3; pleasant , NAD HEENT:  [place]/AT, , EACs-clear, TMs-wnl, NOSE-clear, THROAT-clear NECK:  Supple w/ fair ROM; no JVD; normal carotid impulses w/o bruits; no thyromegaly or nodules palpated; no lymphadenopathy. RESP  Diminshed BS on left  CARD:  RRR, no m/r/g , 2+edema bilaterally with venous insufficiency changes.  GI:   Soft & nt; nml bowel sounds; no organomegaly or masses detected. [place]: Warm bil,  no calf tenderness edema, clubbing, pulses intact Neuro:alert w/ no focal deficits noted.    Impression & Recommendations:  Problem # 1:  PLEURAL EFFUSION (ICD-511.9)  Recurrent pleural effusion  symptomatically improved , no sign. change on xray keep appointment next week with Dr. Edwyna Shell.  Plan:   Decrease Lasix back to 40mg  once daily   Low salt diet, legs elevated.  follow up  Dr. Edwyna Shell next week as  scheduled follow up Dr. Vassie Loll in [facility] in 4 weeks  Taper off metoprolol as recommended by Dr. Eden Emms.  Please contact office for sooner follow up if symptoms do not improve or worsen    Orders: Est. Patient Level IV ([address]109)  Medications Added to Medication List This Visit: 1)  Metoprolol Tartrate 50 Mg Tabs (Metoprolol tartrate) .... Take 1/2 tablet by mouth x5days, then stop as directed by dr. Eden Emms  Complete Medication List: 1)  Diltiazem Hcl Er Beads 120 Mg Xr24h-cap (Diltiazem hcl er beads) .... Take one capsule by mouth daily 2)  Furosemide 40 Mg Tabs (Furosemide) .... Take 1 -2 tablet by mouth once a day 3)  Warfarin Sodium 10 Mg Tabs (Warfarin sodium) .... As directed 4)  Glimepiride 4 Mg Tabs (Glimepiride) .Marland Kitchen.. 1 tab by mouth two times a day 5)  Lantus 100 Unit/ml Soln (Insulin glargine) .... 30 units daily 6)  Flecainide Acetate 100 Mg Tabs (Flecainide acetate) .Marland Kitchen.. 1 two times a day 7)  Levothyroxine Sodium  25 Mcg Tabs (Levothyroxine sodium) .... Take 1 tablet by mouth every morning 8)  Metoprolol Tartrate 50 Mg Tabs (Metoprolol tartrate) .... Take 1/2 tablet by mouth x5days, then stop as directed by dr. Eden Emms 9)  Celebrex 100 Mg Caps (Celecoxib) .... Take 1 tablet by mouth once a day  Patient Instructions: 1)  Decrease Lasix back to 40mg  once daily   2)  Low salt diet, legs elevated.  3)  follow up  Dr. Edwyna Shell next week as scheduled 4)  follow up Dr. Vassie Loll in [facility] in 4 weeks  5)  Taper off metoprolol as recommended by Dr. Eden Emms.  6)  Please contact office for sooner follow up if symptoms do not improve or worsen

## 2010-07-11 NOTE — Consult Note (Signed)
Summary: [facility]  [facility]   Imported By: Marylou Mccoy 10/05/2009 13:21:35  _____________________________________________________________________  External Attachment:    Type:   Image     Comment:   External Document

## 2010-07-11 NOTE — Assessment & Plan Note (Signed)
Summary: [name]   Primary Provider:  Dr. Orrin Brigham  CC:  sob.  History of Present Illness: Kenneth Jennings returns today for F/U of anticoagulation, dyspnea, afib and hypertension.  He failed [facility] in early may.  He has no CAD, an nonischemic myovue normal EF by echo.  We wanted to start him on Flecainide with an eye toward repeat [facility] but he had a trip to [place] and then got pneumonia.  He has been follwed in [place] for this and I will need to get records from [facility] hospital.  He continues to have some dsypnea.  No fever, palpitations, SSCP or syncope.  Discussed risks of Flecainide including proaryythmia and he is willing to take it.  Caustioned him about macrolide antibiotics.  Baseline QT is normal and he will be scheduled for Treadmill on drug in 10 days.  He will continue to have his INR followed in [place].  Current Problems (verified): 1)  Dyspnea  (ICD-786.05) 2)  Edema  (ICD-782.3) 3)  Fatigue  (ICD-780.79) 4)  Hypertension  (ICD-401.9) 5)  Carotid Artery Disease  (ICD-433.10) 6)  Atrial Fibrillation  (ICD-427.31) 7)  Cerebrovascular Accident, Hx of  (ICD-V12.50) 8)  Obesity  (ICD-278.00) 9)  Diabetes Mellitus, Type II  (ICD-250.00) 10)  Somnolence  (ICD-780.09) 11)  Hypercholesterolemia  (ICD-272.0) 12)  Insomnia  (ICD-780.52) 13)  Other Abnormal Blood Chemistry  (ICD-790.6)  Current Medications (verified): 1)  Diltiazem Hcl Er Beads 120 Mg Xr24h-Cap (Diltiazem Hcl Er Beads) .... Take One Capsule By Mouth Daily 2)  Furosemide 20 Mg Tabs (Furosemide) .... Take One Tablet By Mouth Daily. 3)  Warfarin Sodium 10 Mg Tabs (Warfarin Sodium) .... As Directed 4)  Glimepiride 4 Mg Tabs (Glimepiride) .Marland Kitchen.. 1 Tab By Mouth Two Times A Day 5)  Lantus 100 Unit/ml Soln (Insulin Glargine) .... 30 Units Daily 6)  Metoprolol Tartrate 50 Mg Tabs (Metoprolol Tartrate) .... Take One Tablet By Mouth Twice A Day  Allergies (verified): No Known Drug Allergies  Past History:  Past Medical  History: Last updated: 10/05/2009 Current Problems:  FATIGUE (ICD-780.79) HYPERTENSION (ICD-401.9) CAROTID ARTERY DISEASE (ICD-433.10) ATRIAL FIBRILLATION (ICD-427.31) CEREBROVASCULAR ACCIDENT, HX OF (ICD-V12.50) OBESITY (ICD-278.00) DIABETES MELLITUS, TYPE II (ICD-250.00) SOMNOLENCE (ICD-780.09) HYPERCHOLESTEROLEMIA (ICD-272.0) INSOMNIA (ICD-780.52) OTHER ABNORMAL BLOOD CHEMISTRY (ICD-790.6)  Past Surgical History: Last updated: 10/05/2009 Carotid surgery  Family History: Last updated: 10/05/2009 cad  Social History: Last updated: 10/06/2009 Tobacco Use - No.  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   Vital Signs:  Patient profile:   70 year old male Height:      72 inches Weight:      258 pounds BMI:     35.12 Pulse rate:   108 / minute Pulse rhythm:   irregularly irregular Resp:     14 per minute BP sitting:   120 / 82  (left arm)  Vitals Entered By: Kem Parkinson (February 01, 2010 12:03 PM)  Physical Exam  General:  Affect appropriate Healthy:  appears stated age HEENT: normal Neck supple with no adenopathy JVP normal no bruits no thyromegaly Lungs clear with no wheezing and good diaphragmatic motion Heart:  S1/S2 no murmur,rub,  gallop or click PMI normal Abdomen: benighn, BS positve, no tenderness, no AAA no bruit.  No HSM or HJR Distal pulses intact with no bruits No edema Neuro non-focal Skin warm and dry Left CEA   Impression & Recommendations:  Problem # 1:  HYPERTENSION (ICD-401.9) Well controlled His updated medication list for this problem includes:    Diltiazem  Hcl Er Beads 120 Mg Xr24h-cap (Diltiazem hcl er beads) .Marland Kitchen... Take one capsule by mouth daily    Furosemide 20 Mg Tabs (Furosemide) .Marland Kitchen... Take one tablet by mouth daily.    Metoprolol Tartrate 50 Mg Tabs (Metoprolol tartrate) .Marland Kitchen... Take one tablet by mouth twice a day  Problem # 2:  CAROTID ARTERY DISEASE (ICD-433.10) F/U duplex in a year His updated medication list for this problem includes:    Warfarin Sodium 10 Mg Tabs (Warfarin sodium) .Marland Kitchen... As directed  Problem # 3:  ATRIAL FIBRILLATION (ICD-427.31) Start Flecainide 50 two times a day.  F/U ETT 10 days.  Continue coumadin.  f/U 6-8 weeks to see about timing of repeat [facility] The following medications were removed from the medication list:    Flecainide Acetate 100 Mg Tabs (Flecainide acetate) .Marland Kitchen... Take one tablet by mouth every 12 hours His updated medication list for this problem includes:    Warfarin Sodium 10 Mg Tabs (Warfarin sodium) .Marland Kitchen... As directed    Metoprolol Tartrate 50 Mg Tabs (Metoprolol tartrate) .Marland Kitchen... Take one tablet by mouth twice a day  Problem # 4:  HYPERCHOLESTEROLEMIA (ICD-272.0)  Problem # 5:  DYSPNEA (ICD-786.05) Recent Rx for pneumonia.  F/U [facility]  No macrolide antibiotics after starting Felcainide.  Discussed this with him.  No active wheezing at this time.  Consider F/U BNP although EF is normal by echo and nuclear His updated medication list for this problem includes:    Diltiazem Hcl Er Beads 120 Mg Xr24h-cap (Diltiazem hcl er beads) .Marland Kitchen... Take one capsule by mouth daily    Furosemide 20 Mg Tabs (Furosemide) .Marland Kitchen... Take one tablet by mouth daily.    Metoprolol Tartrate 50 Mg Tabs (Metoprolol tartrate) .Marland Kitchen... Take one tablet by mouth twice a day Patient Instructions: 1)  Your physician recommends that you schedule a follow-up appointment in: PLEASE SCHEDWITH DR NISHAN--8WEEKS 2)  Your physician has recommended you make the following change in your medication: PLEASE START FLECANIDE ON 02/04/10 3)  Your physician has  requested that you have an exercise tolerance test.  For further information please visit [email or web address]  Please also follow instruction sheet, as given. SCHEDULED FOR 02/14/10 Prescriptions: FLECAINIDE ACETATE 50 MG TABS (FLECAINIDE ACETATE) TAKE 1 TAB TWICE DAILY--START-03/08/10  #60 x 10   Entered by:   Ledon Snare, RN   Authorized by:   Colon Branch, MD, [facility]   Signed by:   Ledon Snare, RN on 02/01/2010   Method used:   Electronically to        [facility] [phone][address] (retail)       [address]       [place], [place]  [address]       Ph: [phone]       Fax: [phone]   RxID:   [id number]

## 2010-07-11 NOTE — Progress Notes (Signed)
Summary: POST CARDIOVERSION HEART RATE LOW  Phone Note Call from Patient   Caller: Patient [phone] Reason for Call: Talk to Nurse Summary of Call: PT HAD CARDIOVERSION LAST WEEK, HEART RATE IS LOW SINCE YESTERDAY-PLS ADVISE Initial call taken by: Glynda Jaeger,  April 27, 2010 10:02 AM  Follow-up for Phone Call        spoke with pt, he was cardioverted on 04-18-10. he reports heart rate as low as 39. he did not take his metoprolol today and his heart rate is 45. he does not feel faint or dizzy. pt told to cont to hold the metoprolol and will discuss with dr Eden Emms is changes need to be made in the diltiazem or flecainide. Deliah Goody, RN  April 27, 2010 3:01 PM   Additional Follow-up for Phone Call Additional follow up Details #1::        Cut metoprolol to once a day for a week and then stop the second week.  F/U with me Additional Follow-up by: Colon Branch, MD, [facility],  April 27, 2010 4:34 PM    Additional Follow-up for Phone Call Additional follow up Details #2::    pt aware, follow up made Deliah Goody, RN  April 27, 2010 5:20 PM

## 2010-07-11 NOTE — Assessment & Plan Note (Signed)
Summary: HFU- SOB / PT GOING TO [facility] //[facility]   Visit Type:  NP-Hospital Follow-up Primary Randilyn Foisy/Referring Avo Schlachter:  Dr. Orrin Brigham   CC:  Pt c/o SOB with exertion, unable to sleep, and can not take a deep breath.  History of Present Illness: 04/19/2010-  11/12/2011for a  Left pleural effusion due to exudative effusion, etiology unclear. 2. Right upper lobe ground glass nodule on CT scan.  That will need  close  followup.  VQ scan with intermediate probability for pulmonary emboli, felt to  be clinically less likely pulmonary emboli.  Atrial fibrillation, chronic Coumadin. He underwent  thoracentesis, and the diagnostic fluid revealed an exudate.  w/ 330cc removed. Felt to be parapneumonic w/ cytology neg malignant cellls.  Labs revealed a nml BNP, cardiac enzymes. New dx of hypothroid w/ tsh at 5.0, started on synthroid. His serum albumin was nml at 3.9. Previous echo this year 5/20111 showed  LA mod/severe dilated. MIld LVH, EF 65%, Presents today for persistent dyspnea and orthopnea. Wt is 11lbs since 1 month ago, more leg swelling bilateally, Wears out easily, gets very winded and dyspnea is worse at night. Unable to sleep totally reclined. Sleeps on 3 pillows.  CXR today shows increased effusion on left since thoratesis. He was discharged on doxcycline.   Anticoagulation Management History:      Positive risk factors for bleeding include an age of 10 years or older and presence of serious comorbidities.  The bleeding index is 'intermediate risk'.  Positive CHADS2 values include History of HTN and History of Diabetes.  Negative CHADS2 values include Age > 68 years old.  His last INR was 2.5 ratio.     Preventive Screening-Counseling & Management  Alcohol-Tobacco     Smoking Status: quit     Packs/Day: 0.75     Year Started: 1955     Year Quit: Oct 26, 1999  Medications Prior to Update: 1)  Diltiazem Hcl Er Beads 120 Mg Xr24h-Cap (Diltiazem Hcl Er Beads) .... Take One Capsule By Mouth  Daily 2)  Furosemide 20 Mg Tabs (Furosemide) .... Take One Tablet By Mouth Daily. 3)  Warfarin Sodium 10 Mg Tabs (Warfarin Sodium) .... As Directed 4)  Glimepiride 4 Mg Tabs (Glimepiride) .Marland Kitchen.. 1 Tab By Mouth Two Times A Day 5)  Lantus 100 Unit/ml Soln (Insulin Glargine) .... 30 Units Daily 6)  Flecainide Acetate 100 Mg Tabs (Flecainide Acetate) .Marland Kitchen.. 1 Two Times A Day  Current Medications (verified): 1)  Diltiazem Hcl Er Beads 120 Mg Xr24h-Cap (Diltiazem Hcl Er Beads) .... Take One Capsule By Mouth Daily 2)  Furosemide 40 Mg Tabs (Furosemide) .... Take 1 Tablet By Mouth Once A Day 3)  Warfarin Sodium 10 Mg Tabs (Warfarin Sodium) .... As Directed 4)  Glimepiride 4 Mg Tabs (Glimepiride) .Marland Kitchen.. 1 Tab By Mouth Two Times A Day 5)  Lantus 100 Unit/ml Soln (Insulin Glargine) .... 30 Units Daily 6)  Flecainide Acetate 100 Mg Tabs (Flecainide Acetate) .Marland Kitchen.. 1 Two Times A Day 7)  Doxycycline Hyclate 100 Mg Caps (Doxycycline Hyclate) .... Take 1 Tablet By Mouth Every 12 Hours 8)  Levothyroxine Sodium 25 Mcg Tabs (Levothyroxine Sodium) .... Take 1 Tablet By Mouth Every Morning 9)  Metoprolol Tartrate 50 Mg Tabs (Metoprolol Tartrate) .... Take 1 Tablet By Mouth Two Times A Day 10)  Celebrex 100 Mg Caps (Celecoxib) .... Take 1 Tablet By Mouth Once A Day  Allergies (verified): No Known Drug Allergies  Past History:  Past Medical History: Last updated: 10/05/2009 Current  Problems:  FATIGUE (ICD-780.79) HYPERTENSION (ICD-401.9) CAROTID ARTERY DISEASE (ICD-433.10) ATRIAL FIBRILLATION (ICD-427.31) CEREBROVASCULAR ACCIDENT, HX OF (ICD-V12.50) OBESITY (ICD-278.00) DIABETES MELLITUS, TYPE II (ICD-250.00) SOMNOLENCE (ICD-780.09) HYPERCHOLESTEROLEMIA (ICD-272.0) INSOMNIA (ICD-780.52) OTHER ABNORMAL BLOOD CHEMISTRY (ICD-790.6)  Past Surgical History: Last updated: 10/05/2009 Carotid surgery  Social History: Last updated: 10/06/2009 Tobacco Use - No.  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 as his son is an Immunologist and his wife commited suicide Likes to golf and watch [place] sports  Risk Factors: Smoking Status: quit (04/25/2010) Packs/Day: 0.75 (04/25/2010)  Social History: Smoking Status:  quit Packs/Day:  0.75  Review of Systems      See HPI  Vital Signs:  Patient profile:   70 year old male Height:      72 inches Weight:      272 pounds BMI:     37.02 O2 Sat:      92 % on Room air Temp:     98.4 degrees F oral Pulse rate:   65 / minute BP sitting:   144 / 62  (left arm) Cuff size:   large  Vitals Entered By: Zackery Barefoot CMA (April 25, 2010 2:20 PM)  O2 Flow:  Room air CC: Pt c/o SOB with exertion, unable to sleep, can not take a deep breath Comments Medications reviewed with patient Verified contact number and pharmacy with patient Zackery Barefoot CMA  April 25, 2010 2:20 PM    Physical Exam  Additional Exam:  GEN: A/Ox3; pleasant , NAD HEENT:  [place]/AT, , EACs-clear, TMs-wnl, NOSE-clear, THROAT-clear NECK:  Supple w/ fair ROM; no JVD; normal carotid impulses w/o bruits; no thyromegaly or nodules palpated; no lymphadenopathy. RESP  Diminshed BS on left  CARD:  RRR, no m/r/g , 2+edema bilaterally with venous insufficiency changes.  GI:   Soft & nt; nml bowel sounds; no organomegaly or masses detected. [place]: Warm bil,  no calf tenderness edema, clubbing, pulses intact Neuro:alert w/ no focal deficits noted.    Impression & Recommendations:  Problem # 1:  PLEURAL EFFUSION (ICD-511.9)  Exudative pleural effusion suspected to be parapneumonic w/ neg cytology.  xray slightly worse today. He may have componenet of fluid overload as well w/ weight up and leg swelling. will hold on further thoracentesis at this time since seems to have reaccumulated.  (previous albumin was nml . ) Will try increased diuresis w/ close follow up in 3 days.  will recheck xray along w/ lab bnp, bmet and esr on  return.   Orders: T-2 View CXR (71020TC) Est. Patient Level IV (99214)Future Orders: T-2 View CXR ([id number]) ... 04/28/2010  Medications Added to Medication List This Visit: 1)  Furosemide 40 Mg Tabs (Furosemide) .... Take 1 tablet by mouth once a day 2)  Furosemide 40 Mg Tabs (Furosemide) .... Take 1 -2 tablet by mouth once a day 3)  Doxycycline Hyclate 100 Mg Caps (Doxycycline hyclate) .... Take 1 tablet by mouth every 12 hours 4)  Levothyroxine Sodium 25 Mcg Tabs (Levothyroxine sodium) .... Take 1 tablet by mouth every morning 5)  Metoprolol Tartrate 50 Mg Tabs (Metoprolol tartrate) .... Take 1 tablet by mouth two times a day 6)  Celebrex 100 Mg Caps (Celecoxib) .... Take 1 tablet by mouth once a day  Complete Medication List: 1)  Diltiazem Hcl Er Beads 120 Mg Xr24h-cap (Diltiazem hcl er beads) .... Take one capsule by mouth daily 2)  Furosemide 40 Mg Tabs (Furosemide) .... Take 1 -2 tablet  by mouth once a day 3)  Warfarin Sodium 10 Mg Tabs (Warfarin sodium) .... As directed 4)  Glimepiride 4 Mg Tabs (Glimepiride) .Marland Kitchen.. 1 tab by mouth two times a day 5)  Lantus 100 Unit/ml Soln (Insulin glargine) .... 30 units daily 6)  Flecainide Acetate 100 Mg Tabs (Flecainide acetate) .Marland Kitchen.. 1 two times a day 7)  Doxycycline Hyclate 100 Mg Caps (Doxycycline hyclate) .... Take 1 tablet by mouth every 12 hours 8)  Levothyroxine Sodium 25 Mcg Tabs (Levothyroxine sodium) .... Take 1 tablet by mouth every morning 9)  Metoprolol Tartrate 50 Mg Tabs (Metoprolol tartrate) .... Take 1 tablet by mouth two times a day 10)  Celebrex 100 Mg Caps (Celecoxib) .... Take 1 tablet by mouth once a day  Anticoagulation Management Assessment/Plan:      The patient's current anticoagulation dose is Warfarin sodium 10 mg tabs: as directed.        Patient Instructions: 1)  Finish Antibiotics.  2)  Increase lasix 40mg  2 by mouth once daily for next 3 days until seen in office Friday 11/18  3)  Low salt diet, legs elevated.   4)  follow up in office 04/28/10 at 11:30 (our [place] office), [address] [place], [place] [address]  5)  Please contact office for sooner follow up if symptoms do not improve or worsen  Prescriptions: FUROSEMIDE 40 MG TABS (FUROSEMIDE) Take 1 -2 tablet by mouth once a day  #60 x 1   Entered and Authorized by:   Rubye Oaks NP   Signed by:   Rubye Oaks NP on 04/25/2010   Method used:   Electronically to        [facility]. [phone]* (retail)       [address]       [place], [place]  [address]       Ph: [phone]       Fax: [phone]   RxID:   [id number]   Appended Document: HFU- SOB / PT GOING TO [facility] //[facility] Tammy, I had done his consult. I reviewed thora results: this is an EXUDATE. Cytology negative for malignant cells but lot of lymphocytes and mesothelial cells. This is an IDIOPATHIC EXUDATE therefore. Fluid has recurreed. He needs Edwyna Shell to see this patient for VATS examination and pleurodesis to rule out malignancy. If you want me to call patient and discuss this I am happy to - just let me know. THanks, MR  Appended Document: HFU- SOB / PT GOING TO [facility] //[facility] EXUDATE. Cytology negative for malignant cells but lot of lymphocytes and mesothelial cells. This is an IDIOPATHIC EXUDATE therefore. Fluid has recurreed. He needs Edwyna Shell to see this patient for VATS examination and pleurodesis to rule out malignancy.  pt made aware of recommendation he is feeling better this am with decreased dyspnea.  awaiting ov w/ .Dr. Edwyna Shell

## 2010-07-11 NOTE — Progress Notes (Signed)
Summary: Med List  Med List   Imported By: Roderic Ovens 11/23/2009 15:49:39  _____________________________________________________________________  External Attachment:    Type:   Image     Comment:   External Document

## 2010-07-13 NOTE — Progress Notes (Signed)
Summary: pt request call  Phone Note Call from Patient Call back at Home Phone [phone] Call back at [phone]   Caller: Patient Reason for Call: Talk to Nurse Initial call taken by: Judie Grieve,  June 19, 2010 11:10 AM  Follow-up for Phone Call        spoke with pt, since he started the amiodarone his heart rate is running low. he reports a heart rate of 30 on sunday. pt is currently taking 400mg  two times a day. pt instructed to decrease amio to 200mg  two times a day . he has an appt to see dr Eden Emms this week. Deliah Goody, RN  June 19, 2010 1:30 PM     New/Updated Medications: AMIODARONE HCL 400 MG TABS (AMIODARONE HCL) Take one half  tablet by mouth twice a day

## 2010-07-13 NOTE — Progress Notes (Signed)
Summary: PT/ INR results  Phone Note From Other Clinic   Caller: clara from [facility]  [phone]-  Request: Talk with Nurse Summary of Call: INR 2.8 PT 34.2 DRAWN ON 1-3. Initial call taken by: Lorne Skeens,  June 13, 2010 2:52 PM  Follow-up for Phone Call        Tiffany, RN spoke with RN at [facility] on 1/3.  Stated they are adjusting Coumadin, just wanted Dr. Eden Emms to be aware of INR results.  Gave RN main fax number to send results.   Weston Brass PharmD  June 14, 2010 12:09 PM

## 2010-07-13 NOTE — Assessment & Plan Note (Signed)
Summary: 3wk f/u sl   Primary Provider:  Dr. Orrin Brigham   CC:  check up..medication changes...pt didnot take medication today.  History of Present Illness: Kenneth Jennings is seen post hosptial D/C  He had a VATS procedure and did well.  He had a chronic left pleural effuion with dyspnea.  He had some PAflutter post op.  He has had two previous cardioversions and been on both 50 and 100mg  of Flecainide with persistant PAF.  He feels horrible when he is in afib and feels that he is having episodes every few days.  Dyspnea improved post VATS.  I talked with him at length and and it is obvous a different drug or approach is needed.  He is prone to bradycardia and could not tolerate being on both BB's and Calcium blockers.  This would make me a littler hesitant to use rhythmol.  He has no documented CAD with normal cath 2002 and normal myovue and ETT this year Septal thickness is 11mm  12/21 after discussion with Dr Johney Frame started amiodarone.  On 200 two times a day now.  Had nausea and SOB on higher dose.  Still somewhat bradycardic.  Will stop cardizem and add Cozaar for BP.  F/U labs including LFT's and TSH in 6 weeks.  He has F/U with Dr Johney Frame in 2/12 and [place] soon as well.  Will need PFT's and DLCO in March.  Will likely need PPM in future but will see how he does with this combination of meds.  Current Problems (verified): 1)  Pleural Effusion  (ICD-511.9) 2)  Neck Pain  (ICD-723.1) 3)  Encounter For Long-term Use of Other Medications  (ICD-V58.69) 4)  Dyspnea  (ICD-786.05) 5)  Edema  (ICD-782.3) 6)  Fatigue  (ICD-780.79) 7)  Hypertension  (ICD-401.9) 8)  Carotid Artery Disease  (ICD-433.10) 9)  Atrial Fibrillation  (ICD-427.31) 10)  Cerebrovascular Accident, Hx of  (ICD-V12.50) 11)  Obesity  (ICD-278.00) 12)  Diabetes Mellitus, Type II  (ICD-250.00) 13)  Somnolence  (ICD-780.09) 14)  Hypercholesterolemia  (ICD-272.0) 15)  Insomnia  (ICD-780.52) 16)  Other Abnormal Blood Chemistry   (ICD-790.6)  Current Medications (verified): 1)  Diltiazem Hcl Er Beads 120 Mg Xr24h-Cap (Diltiazem Hcl Er Beads) .... Take One Capsule By Mouth Daily 2)  Furosemide 40 Mg Tabs (Furosemide) .Marland Kitchen.. 1  Tablets By Mouth Once A Day 3)  Warfarin Sodium 10 Mg Tabs (Warfarin Sodium) .... As Directed 4)  Glimepiride 4 Mg Tabs (Glimepiride) .Marland Kitchen.. 1 Tab By Mouth Two Times A Day 5)  Lantus 100 Unit/ml Soln (Insulin Glargine) .... 30 Units Daily 6)  Levothyroxine Sodium 25 Mcg Tabs (Levothyroxine Sodium) .... Take 1 Tablet By Mouth Every Morning 7)  Zolpidem Tartrate 10 Mg Tabs (Zolpidem Tartrate) .... As Needed 8)  Amiodarone Hcl 400 Mg Tabs (Amiodarone Hcl) .... 1/2 Tab By Mouth Two Times A Day  Allergies (verified): No Known Drug Allergies  Past History:  Past Medical History: Last updated: 10/05/2009 Current Problems:  FATIGUE (ICD-780.79) HYPERTENSION (ICD-401.9) CAROTID ARTERY DISEASE (ICD-433.10) ATRIAL FIBRILLATION (ICD-427.31) CEREBROVASCULAR ACCIDENT, HX OF (ICD-V12.50) OBESITY (ICD-278.00) DIABETES MELLITUS, TYPE II (ICD-250.00) SOMNOLENCE (ICD-780.09) HYPERCHOLESTEROLEMIA (ICD-272.0) INSOMNIA (ICD-780.52) OTHER ABNORMAL BLOOD CHEMISTRY (ICD-790.6)  Past Surgical History: Last updated: 10/05/2009 Carotid surgery  Family History: Last updated: 10/05/2009 cad  Social History: Last updated: 10/06/2009 Tobacco Use - No.  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  No HSM or HJR Distal pulses intact with no  bruits No edema Neuro non-focal Skin warm and dry   Vital Signs:  Patient profile:   70 year old male Height:      72 inches Weight:      253 pounds BMI:     34.44 Pulse rate:   44 / minute Resp:     14 per minute BP sitting:   160 / 60  (left arm)  Vitals Entered By: Kem Parkinson (June 22, 2010 8:47 AM)  Physical Exam  General:  Affect appropriate Healthy:  appears stated age HEENT: normal Neck supple with no adenopathy JVP normal no bruits no thyromegaly Lungs clear with no wheezing and good diaphragmatic motion Heart:  S1/S2 no murmur,rub, gallop or click PMI normal Abdomen: benighn, BS positve, no tenderness, no AAA no bruit.  No HSM or HJR Distal pulses intact with no bruits No edema Neuro non-focal Skin warm and dry S/P VATS on left   Impression & Recommendations:  Problem # 1:  PLEURAL EFFUSION (ICD-511.9) F/U [place].  S/P VATS Exam ok.  DLCO and PFTS 3/12 on amiodarone  Problem # 2:  HYPERTENSION (ICD-401.9) Add Cozaar and follow His updated medication list for this problem includes:    Losartan Potassium 50 Mg Tabs (Losartan potassium) ..... One tablet by mouth once daily    Furosemide 40 Mg Tabs (Furosemide) .Marland Kitchen... 1  tablets by mouth once a day  Problem # 3:  CAROTID ARTERY DISEASE (ICD-433.10) S/P left CEA with residual bruit  F/U duplex in 6 months His updated medication list for this problem includes:    Warfarin Sodium 10 Mg Tabs (Warfarin sodium) .Marland Kitchen... As directed  Problem # 4:  ATRIAL FIBRILLATION (ICD-427.31) Failed Flecainide  Nausea and SOB on higher dose Amiodarone.  Continue 200 two times a day for 3 months then decrease to 200 daily.  F/U [facility] as afib ablation and pacer may be in his future His updated medication list for this problem includes:    Warfarin Sodium 10 Mg Tabs (Warfarin sodium) .Marland Kitchen... As directed    Amiodarone Hcl 200 Mg Tabs (Amiodarone hcl) .Marland Kitchen... Take one tablet by mouth twice a day  Problem # 5:  COUMADIN  THERAPY (ICD-V58.61) F/U primary.  More sensitive on amiodarone.  Dose decreased appropriately  Patient Instructions: 1)  Your physician recommends that you schedule a follow-up appointment in: 8 WEEKS 2)  Your physician recommends that you return for lab work in:6 WEEKS 3)  Your physician has recommended you make the following change in your medication: STOP DILTIAZEM 4)  START LOSARTAN 50MG  ONCE DAILY Prescriptions: AMIODARONE HCL 200 MG TABS (AMIODARONE HCL) Take one tablet by mouth twice a day  #180 x 4   Entered by:   Deliah Goody, RN   Authorized by:   Colon Branch, MD, [facility]   Signed by:   Deliah Goody, RN on 06/22/2010   Method used:   Print then Give to Patient   RxID:   [id number] LOSARTAN POTASSIUM 50 MG TABS (LOSARTAN POTASSIUM) one tablet by mouth once daily  #90 x 4   Entered  by:   Deliah Goody, RN   Authorized by:   Colon Branch, MD, [facility]   Signed by:   Deliah Goody, RN on 06/22/2010   Method used:   Print then Give to Patient   RxID:   [id number]

## 2010-07-13 NOTE — Progress Notes (Signed)
Summary: meds is too costly-NEEDED RX FAXED TO [facility]  Phone Note Call from Patient Call back at Home Phone [phone]   Caller: Patient Reason for Call: Talk to Nurse Summary of Call: pt was seen on yesterday - was given some rx - pt states it's too costly.  Initial call taken by: Lorne Skeens,  June 01, 2010 9:16 AM  Follow-up for Phone Call        NA Katina Dung, RN, BSN  June 01, 2010 9:23 AM   Per pt calling back [phone] Lorne Skeens  June 01, 2010 3:14 PM     Prescriptions: AMIODARONE HCL 400 MG TABS (AMIODARONE HCL) Take one tablet by mouth twice a day  #180 x 3   Entered by:   Charolotte Capuchin, RN   Authorized by:   Colon Branch, MD, [facility]   Signed by:   Charolotte Capuchin, RN on 06/01/2010   Method used:   Faxed to ...       [facility] (mail-order)             , [place]         Ph: [phone]       Fax: [phone]   RxID:   [id number]

## 2010-07-13 NOTE — Progress Notes (Signed)
Summary: nos appt  Phone Note Call from Patient   Caller: [email or web address]  Call For: Vista Sawatzky Summary of Call: In ref to nos from 12/20, pt states he doesn't want to rsc re: he found another physician. Initial call taken by: Darletta Moll,  May 31, 2010 9:10 AM

## 2010-07-13 NOTE — Progress Notes (Signed)
Summary: c/o heart out rhythm  Phone Note Call from Patient Call back at Home Phone [phone]   Caller: Patient Reason for Call: Talk to Nurse Details for Reason: c/o heart is out of rhythm again. no chestpain nor sob. b/p last night 173/ 100. hr 126. Initial call taken by: Lorne Skeens,  May 24, 2010 9:52 AM  Follow-up for Phone Call        spoke with pt, he went out of rhythm last night and his heart rate went up to 126. this am he is still out of rhythm and his pulse is 116 at present. discussed with dr mclean(DOD). pt instructed to increase diltiazem to 240 mg once daily. he has a follow up appt with dr Edwyna Shell today. he will call if heart rate does not settle down or if develops symptoms. Follow-up by: Deliah Goody, RN,  May 24, 2010 10:24 AM

## 2010-07-13 NOTE — Progress Notes (Signed)
Summary: c/o hr is low 36- now 44  Phone Note Call from Patient Call back at Home Phone [phone]   Caller: Patient Reason for Call: Talk to Nurse Summary of Call: c/o hr is low 36 this am. his heart rate now is 44.  Initial call taken by: Lorne Skeens,  July 03, 2010 2:42 PM  Follow-up for Phone Call        Pt. calling b/c his HR was 36 and BP 137/68 this am. His HR is 44 now with BP 121/64. He feels okay just feels some palpitations but believes his pulse is regular. He only took half of his Amiodarone two times a day yesterday and has taken 1/2 this morning. Spoke with Dr. Daleen Squibb, we will continue taking 1/2 of Amiodarone two times a day so 200mg  daily and have him come in for an EKG in 2 days. Will advise patient to monitor his BP & HR & if he becomes SOB, dizzy, or lethargic to call 911.  Whitney Maeola Sarah RN  July 03, 2010 3:05 PM  Spoke with patient, he would like to come into the office tomorrow for an EKG. I put him on the nurse room schedule for 10am. Whitney Maeola Sarah RN  July 03, 2010 3:12 PM   Follow-up by: Whitney Maeola Sarah RN,  July 03, 2010 3:12 PM

## 2010-07-13 NOTE — Letter (Signed)
Summary: [facility] Office Visit Note   [facility] Office Visit Note   Imported By: Roderic Ovens 06/27/2010 14:26:49  _____________________________________________________________________  External Attachment:    Type:   Image     Comment:   External Document

## 2010-07-13 NOTE — Assessment & Plan Note (Signed)
Summary: ekg/427.31/wa  Nurse Visit   Vital Signs:  Patient profile:   70 year old male Height:      72 inches Weight:      262.25 pounds Pulse rate:   44 / minute Pulse rhythm:   regular Resp:     14 per minute BP sitting:   144 / 70  (left arm)  Vitals Entered By: Ellender Hose RN (July 04, 2010 10:32 AM) CC: no cardiac complaints. Comments Patient called yesterday when he noticed his HR was in the upper 30's. No c/o sob, cp, lethargic, or [place]. He came in for an EKG today which showed sinus bradycardia with HR 44. Dr. Graciela Husbands reviewed. Patient's BP 144/70. He is currently taking Amiodarone 200mg  daily. Advised him to keep a record of his HR/BP readings and to notify us if he becomes symptomatic with his bradycardia. He will f/u with Dr. Johney Frame on 07/10/10. Will forward this note to Dr. Eden Emms for review.   Allergies: No Known Drug Allergies

## 2010-07-13 NOTE — Assessment & Plan Note (Signed)
Summary: eph/jml   Primary Kenneth Jennings:  Dr. Orrin Brigham    History of Present Illness: Kenneth Jennings is seen post hosptial D/C  He had a VATS procedure and did well.  He had a chronic left pleural effuion with dyspnea.  He had some PAflutter post op.  He has had two previous cardioversions and been on both 50 and 100mg  of Flecainide with persistant PAF.  He feels horrible when he is in afib and feels that he is having episodes every few days.  Dyspnea improved post VATS.  I talked with him at length and and it is obvous a different drug or approach is needed.  He is prone to bradycardia and could not tolerate being on both BB's and Calcium blockers.  This would make me a littler hesitant to use rhythmol.  He has no documented CAD with normal cath 2002 and normal myovue and ETT this year Septal thickness is 11mm  I sploke with Dr Johney Frame one of our EP docs.  We felt the best option was to stop Flecainide and begin Amiodarone 400bid 48 hrs after stopping the Flecainide.  This will make him more sensitive to coumadin.  We will contact Dr Gean Maidens' office in [facility] and make sure his INR is checkes about a week after starting amiodarone and not next Tuesday.  He had been runnign on the low side so I think he will be fine on amiodarone.  The amiodarone is likely to be most effective, does not need hospitalization.  We will have to see what his long term lung issues evolve into  Current Problems (verified): 1)  Pleural Effusion  (ICD-511.9) 2)  Neck Pain  (ICD-723.1) 3)  Encounter For Long-term Use of Other Medications  (ICD-V58.69) 4)  Dyspnea  (ICD-786.05) 5)  Edema  (ICD-782.3) 6)  Fatigue  (ICD-780.79) 7)  Hypertension  (ICD-401.9) 8)  Carotid Artery Disease  (ICD-433.10) 9)  Atrial Fibrillation  (ICD-427.31) 10)  Cerebrovascular Accident, Hx of  (ICD-V12.50) 11)  Obesity  (ICD-278.00) 12)  Diabetes Mellitus, Type II  (ICD-250.00) 13)  Somnolence  (ICD-780.09) 14)  Hypercholesterolemia  (ICD-272.0) 15)   Insomnia  (ICD-780.52) 16)  Other Abnormal Blood Chemistry  (ICD-790.6)  Current Medications (verified): 1)  Diltiazem Hcl Er Beads 120 Mg Xr24h-Cap (Diltiazem Hcl Er Beads) .... Take One Capsule By Mouth Daily 2)  Furosemide 40 Mg Tabs (Furosemide) .Marland Kitchen.. 1  Tablets By Mouth Once A Day 3)  Warfarin Sodium 10 Mg Tabs (Warfarin Sodium) .... As Directed 4)  Glimepiride 4 Mg Tabs (Glimepiride) .Marland Kitchen.. 1 Tab By Mouth Two Times A Day 5)  Lantus 100 Unit/ml Soln (Insulin Glargine) .... 30 Units Daily 6)  Levothyroxine Sodium 25 Mcg Tabs (Levothyroxine Sodium) .... Take 1 Tablet By Mouth Every Morning 7)  Zolpidem Tartrate 10 Mg Tabs (Zolpidem Tartrate) .Marland Kitchen.. 1  Tab By Mouth At Bedtime 8)  Amiodarone Hcl 400 Mg Tabs (Amiodarone Hcl) .... Take One Tablet By Mouth Twice A Day  Allergies (verified): No Known Drug Allergies  Past History:  Past Medical History: Last updated: 10/05/2009 Current Problems:  FATIGUE (ICD-780.79) HYPERTENSION (ICD-401.9) CAROTID ARTERY DISEASE (ICD-433.10) ATRIAL FIBRILLATION (ICD-427.31) CEREBROVASCULAR ACCIDENT, HX OF (ICD-V12.50) OBESITY (ICD-278.00) DIABETES MELLITUS, TYPE II (ICD-250.00) SOMNOLENCE (ICD-780.09) HYPERCHOLESTEROLEMIA (ICD-272.0) INSOMNIA (ICD-780.52) OTHER ABNORMAL BLOOD CHEMISTRY (ICD-790.6)  Past Surgical History: Last updated: 10/05/2009 Carotid surgery  Family History: Last updated: 10/05/2009 cad  Social History: Last updated: 10/06/2009 Tobacco Use - No.  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   Vital Signs:  Patient profile:   70 year old male Height:       72 inches Weight:      259 pounds BMI:     35.25 Pulse rate:   75 / minute Pulse rhythm:   regular Resp:     14 per minute BP sitting:   135 / 75  (left arm)  Vitals Entered By: Kem Parkinson (May 31, 2010 2:50 PM)  Physical Exam  General:  Affect appropriate Healthy:  appears stated age HEENT: normal Neck supple with no adenopathy JVP normal no bruits no thyromegaly Lungs clear with no wheezing and good diaphragmatic motion Heart:  S1/S2 no murmur,rub, gallop or click PMI normal Abdomen: benighn, BS positve, no tenderness, no AAA no bruit.  No HSM or HJR Distal pulses intact with no bruits No edema Neuro non-focal Skin warm and dry Recent left VATS healed well   Impression & Recommendations:  Problem # 1:  PLEURAL EFFUSION (ICD-511.9) S/P VATS F/U pulmonary Decreased fluid on exam  healed well  Problem # 2:  HYPERTENSION (ICD-401.9) Well controlled His updated medication list for this problem includes:    Diltiazem Hcl Er Beads 120 Mg Xr24h-cap (Diltiazem hcl er beads) .Marland Kitchen... Take one capsule by mouth daily    Furosemide 40 Mg Tabs (Furosemide) .Marland Kitchen... 1  tablets by mouth once a day  Problem # 3:  ATRIAL FIBRILLATION (ICD-427.31) Stop Flecainide  Start Amiodarone 400bid for 2-3 weeks then drop to 200 two times a day.  Closer F/U of INR as he will be more sensitive PT 1/3 at [facility]' office.  F/U with me 3-4  weeks and Dr Johney Frame there after to discuss ablation or other drugs The following medications were removed from the medication list:    Flecainide Acetate 100 Mg Tabs (Flecainide acetate) .Marland Kitchen... 1 two times a day His updated medication list for this problem includes:    Warfarin Sodium 10 Mg Tabs (Warfarin sodium) .Marland Kitchen... As directed    Amiodarone Hcl 400 Mg Tabs (Amiodarone hcl) .Marland Kitchen... Take one tablet by mouth twice a day  Orders: EP Referral (Cardiology EP Ref )  Patient Instructions: 1)  Your physician recommends that you schedule a follow-up appointment  in: 2-3 weeks with Dr. Eden Emms 2)  Your physician has recommended you make the following change in your medication: START AMIODARONE ON SATURDAY 06/03/10 3)  You have been referred to Dr. Johney Frame  4)  1 week after starting Amiodarone have your coumadin checked on June 13, 2010 with your Dr. Valda Favia Prescriptions: AMIODARONE HCL 400 MG TABS (AMIODARONE HCL) Take one tablet by mouth twice a day  #60 x 3   Entered by:   Lisabeth Devoid RN   Authorized by:   Colon Branch, MD, [facility]   Signed by:   Lisabeth Devoid RN on 05/31/2010   Method used:   Electronically to        [facility] [phone]* (retail)       [address]       [place], [place]  [address]       Ph: [phone]       Fax:  [phone]   RxID:   [id number]    EKG Report  Procedure date:  05/31/2010  Findings:      NSR 75 QT 396 No sig change otherwise normal

## 2010-07-13 NOTE — Progress Notes (Signed)
Summary: change meds  Phone Note Call from Patient Call back at Home Phone [phone]   Caller: Patient Reason for Call: Talk to Nurse Summary of Call: pt can not afford new rx AMIODARONE HCL 400 MG TABS. pt would like to change med. Initial call taken by: Roe Coombs,  June 08, 2010 2:17 PM  Follow-up for Phone Call        spoke with pt, he states the amiodarone is over 200 dollars and he can not afford it. he currently has a 30 day supply and has been taking it but will not be able to get it refilled. will discuss with dr Verdis Prime, RN  June 08, 2010 5:25 PM   Additional Follow-up for Phone Call Additional follow up Details #1::        discussed with dr Eden Emms, pt to finish what amiodarone he has and then follow up with dr allred on the 30th as scheduled. pt voiced understanding Deliah Goody, RN  June 13, 2010 11:49 AM

## 2010-07-13 NOTE — Progress Notes (Signed)
Summary: question on new meds  Phone Note Call from Patient Call back at Home Phone [phone]   Caller: Patient Reason for Call: Talk to Nurse Summary of Call: question on new meds Initial call taken by: Roe Coombs,  June 02, 2010 3:22 PM  Follow-up for Phone Call        11/23/111545 pm--pt calling stating he is hesitant to start amiodarone as it is not reccomended for people taking diuretics--pt is on furosemide--advised dr Eden Emms would not put pt on anything that wasn't safe for them and if he didn't want to take i would let dr Eden Emms know--pt states he will try it, but what happens if he gets sic during holidays--advised, we always have someone on call and just needs to call our number and answering service will have someone call him back--pt agrees Follow-up by: Ledon Snare, RN,  June 02, 2010 3:44 PM

## 2010-07-18 ENCOUNTER — Ambulatory Visit: Payer: Self-pay | Admitting: Cardiovascular Disease

## 2010-07-18 ENCOUNTER — Other Ambulatory Visit: Payer: Self-pay | Admitting: Thoracic Surgery

## 2010-07-18 DIAGNOSIS — J90 Pleural effusion, not elsewhere classified: Secondary | ICD-10-CM

## 2010-07-19 ENCOUNTER — Telehealth: Payer: Self-pay | Admitting: Cardiovascular Disease

## 2010-07-19 ENCOUNTER — Other Ambulatory Visit: Payer: Self-pay

## 2010-07-19 ENCOUNTER — Ambulatory Visit
Admission: RE | Admit: 2010-07-19 | Discharge: 2010-07-19 | Disposition: A | Discharge status: Home / Self Care | Payer: Medicare PPO | Source: Ambulatory Visit | Attending: Thoracic Surgery | Admitting: Thoracic Surgery

## 2010-07-19 ENCOUNTER — Ambulatory Visit (INDEPENDENT_AMBULATORY_CARE_PROVIDER_SITE_OTHER): Payer: Self-pay | Admitting: Thoracic Surgery

## 2010-07-19 DIAGNOSIS — J90 Pleural effusion, not elsewhere classified: Secondary | ICD-10-CM

## 2010-07-19 NOTE — Letter (Signed)
Summary: [name] MD/[facility]  [name] MD/[facility]   Imported By: Lester Ferrelview 07/11/2010 09:51:15  _____________________________________________________________________  External Attachment:    Type:   Image     Comment:   External Document

## 2010-07-19 NOTE — Assessment & Plan Note (Signed)
Summary: dicuss a-fib ablation/sl   Visit Type:  Follow-up Referring Provider:  Dr Eden Emms Primary Provider:  Dr. Orrin Brigham    History of Present Illness: Kenneth Jennings is a 70 yo WM with a h/o longstanding persistent atrial fibrillation who presents today for EP consultation.  He reports having afib for "years".  He has had multiple prior cardioversions and has failed medical therapy with flecainide. He reports extreme fatigue and decreased exercise tolerance during afib.  He was recently placed on amiodarone.  He reports that his afib is presently controlled with amiodarone.  He has had occasional bradycardia with heart rates in the 40s but does not feel that he is overly symptomatic at this time.  Presently, he takes amiodarone 100mg  two times a day.  He has been treated with coumadin for several years.  He denies dizziness, presyncope, or syncope.  He has occasional fatigue but feels that this is primarily related to insomnia.  He also reports deviated nasal septum and states that he was previously told that he needed CPAP. He previously underwent VATS with pleurodesis 12/11 for chronic left pleural effuion with dyspnea.    He feels that his breathing has signifcantly improved post VATS.  He is prone to bradycardia and could not tolerate being on both BB's and Calcium blockers.    Current Medications (verified): 1)  Losartan Potassium 50 Mg Tabs (Losartan Potassium) .... One Tablet By Mouth Once Daily 2)  Furosemide 40 Mg Tabs (Furosemide) .Marland Kitchen.. 1  Tablets By Mouth Once A Day 3)  Warfarin Sodium 10 Mg Tabs (Warfarin Sodium) .... As Directed 4)  Glimepiride 4 Mg Tabs (Glimepiride) .Marland Kitchen.. 1 Tab By Mouth Two Times A Day 5)  Lantus 100 Unit/ml Soln (Insulin Glargine) .... 30 Units Daily 6)  Levothyroxine Sodium 25 Mcg Tabs (Levothyroxine Sodium) .... Take 1 Tablet By Mouth Every Morning 7)  Zolpidem Tartrate 10 Mg Tabs (Zolpidem Tartrate) .... As Needed 8)  Amiodarone Hcl 200 Mg Tabs (Amiodarone Hcl) ....  Take One Half Tablet By Mouth Twice A Day  Allergies (verified): No Known Drug Allergies  Past History:  Past Medical History: HYPERTENSION (ICD-401.9) Persistent ATRIAL FIBRILLATION (ICD-427.31) CEREBROVASCULAR ACCIDENT 10 years ago OBESITY (ICD-278.00) DIABETES MELLITUS, TYPE II (ICD-250.00) SOMNOLENCE (ICD-780.09) HYPERCHOLESTEROLEMIA (ICD-272.0) INSOMNIA (ICD-780.52) chronic pleural effusion Possibly sleep apnea (reports being told to wear CPAP previously) deviated nasal septum  Past Surgical History: L CEA 2009 VATS with pleurodesis for chronic effusion 12/11  Family History: Reviewed history from 10/05/2009 and no changes required. cad  Social History: Reviewed history from 10/06/2009 and no changes required. Tobacco Use - No.  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   Vital Signs:  Patient profile:   70 year old male Height:      72 inches Weight:      262 pounds BMI:     35.66 Pulse rate:   46 / minute BP sitting:   140 / 70  (left arm)  Vitals Entered By: Laurance Flatten CMA (July 10, 2010 11:23 AM)  Physical Exam  General:  Affect appropriate Healthy:  appears stated age HEENT: normal Neck supple with no adenopathy JVP normal no bruits no thyromegaly Lungs clear with no wheezing and good diaphragmatic motion Heart:  RRR  S1/S2 no murmur,rub, gallop or click PMI normal Abdomen: benighn, BS positve, no tenderness, no AAA no bruit.  No HSM or HJR Distal pulses intact with no bruits No edema Neuro non-focal Skin warm and dry S/P VATS on left  Echocardiogram  Procedure date:  10/10/2009  Findings:       Study Conclusions    - Left ventricle: The cavity size was normal. Wall thickness was     increased in a pattern of  mild LVH. The estimated ejection     fraction was 65%. Wall motion was normal; there were no regional     wall motion abnormalities.   - Left atrium: The atrium was moderately to severely dilated.   - Pulmonary arteries: PA peak pressure: 33mm Hg (S).   Transthoracic echocardiography. M-mode, complete 2D, spectral   Doppler, and color Doppler. Height: Height: 185.4cm. Height: 73in.   Weight: Weight: 115.9kg. Weight: 255lb. Body mass index: BMI:   33.7kg/m^2. Body surface area: BSA: 2.29m^2. Blood pressure: 149/82.   Patient status: Outpatient. Location: [facility]    EKG  Procedure date:  07/04/2010  Findings:      sinus bradycardia 44 bpm, PR 196, QRS 98, Qtc 408  Impression & Recommendations:  Problem # 1:  ATRIAL FIBRILLATION (ICD-427.31) presently maintaining sinus rhythm with amiodarone 200mg  daily he is appropriately anticoagulated with coumadin. His LA size by recent echo was 59 mm and I suspect that our abililty to maintain sinus longterm is quite limited. At this time, no medicine changes are advised.  Given his severe LA enlargement, he is not a canddiate for pulmonary vein isolation.  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  He declines pacemaker implantation.  He appears to be doing well presently and has very little sypmtoms of bradycardia.  I therefore think that this is a reasonable decision.  Problem # 3:  INSOMNIA (ICD-780.52) he attributes as the major cause for fatigue I have advised repeat sleep study to evaluate sleep apnea He declines but will discuss with Dr Eden Emms in follow-up.   Patient Instructions: 1)  Your physician recommends that you schedule a follow-up appointment as needed. follow-up with Dr. Eden Emms. 2)  Your physician recommends that you continue on your current medications as directed. Please refer to the Current Medication list given  to you today.

## 2010-07-21 NOTE — Assessment & Plan Note (Signed)
OFFICE VISIT  Kenneth Jennings, Kenneth Jennings DOB:  May 24, 1941                                        July 19, 2010 CHART #:  [id number]  The patient returned today and his chest x-ray shows no recurrence of his effusion, there is just a small reaction in his left base.  His blood pressure is 185/62, pulse 42, respirations 20, sats were 94%.  His atrial fibrillation is much better controlled.  His incisions are well healed.  I will just let him be followed up by his medical doctor as well as Dr. Eden Emms.  [name], M.D. Electronically Signed  DPB/MEDQ  D:  07/19/2010  T:  07/19/2010  Job:  [id number]

## 2010-07-25 ENCOUNTER — Telehealth (INDEPENDENT_AMBULATORY_CARE_PROVIDER_SITE_OTHER): Payer: Self-pay | Admitting: *Deleted

## 2010-07-25 ENCOUNTER — Encounter: Payer: Self-pay | Admitting: Cardiovascular Disease

## 2010-07-26 ENCOUNTER — Other Ambulatory Visit: Payer: Self-pay

## 2010-07-27 NOTE — Progress Notes (Signed)
Summary: pt did not want to say  Phone Note Call from Patient Call back at Home Phone [phone] P PH     Caller: Patient Reason for Call: Talk to Nurse Summary of Call: pt would like to a nurse. pt did not want to say why he needed a nurse pt stated he only wanted to talk to a nurse. Initial call taken by: Roe Coombs,  July 19, 2010 4:40 PM  Follow-up for Phone Call        Pt. called to c/o that he  came to the office for lab work this morning signed in. He waited for 50 minutes and no one called him to get the  blood. Pt. went home. He would like to have his lab order to be fax to his PCP Dr.John Gehris. Pt. states,  it is  too far for him to come back. An order was faxed to [phone]. for BMP,LFT and TSH. Results to be fax result back to [place]. Follow-up by: Ollen Gross, RN, BSN,  July 19, 2010 5:21 PM

## 2010-08-01 ENCOUNTER — Encounter: Payer: Medicare PPO | Admitting: Physician Assistant

## 2010-08-02 NOTE — Progress Notes (Signed)
Summary: Cardiology Phone Note - High BP  Phone Note Call from Patient   Caller: Patient Summary of Call: Pt called to report high BP. He had seen his PCP yesterday for complaints of a cold. His BP at that visit was noted to be 200/90 and he was given a prescription for Amlodipine 10mg  daily which he took around 11:30am. It reduced his blood pressure to 176 systolic but soon after shot up to 225/118. He took his scheduled losartan 50mg  at 6pm with a most recent blood pressure around 8pm of 205/94. He had a very mild headache earlier but otherwise feels well without chest pain, dizziness, visual changes, weakness, or loss of balance. Advised pt to recheck blood pressure in 1 hour and take an additional Losartan if his systolic pressure is still greater than 170. (Pt's BP in office has run 140-160 per notes.) I have left message for pt to be seen in clinic tomorrow to discuss further mgmt given his h/o bradycardia. He states he had labwork done recently at PCP and this is being faxed to our office. He also has a wrist cuff and isn't sure if it's accurate so instructed pt to bring his cuff in to visit to be calibrated. Also instructed to proceed to ER for CP, SOB, headache, weakness, or other concerning sx. He expressed understanding. Initial call taken by: Ronie Spies PA-C

## 2010-08-04 ENCOUNTER — Telehealth: Payer: Self-pay | Admitting: Cardiovascular Disease

## 2010-08-08 NOTE — Progress Notes (Signed)
Summary: high blood pressure   Phone Note Call from Patient Call back at Home Phone [phone]   Caller: Patient Reason for Call: Talk to Nurse Summary of Call: pt states blood pressure is up 196/96 last night. this morning was 168/80. pt states he is taking his meds as well but they are not helping. no sob no chest pain. pt states he been having headaches and dizziness. Initial call taken by: Roe Coombs,  August 04, 2010 8:16 AM  Follow-up for Phone Call        spoke with pt, he has noticed his bp running alittle higher than usual. last night it was 190's. his medical doctor about one week ago started him on amlodipine 10mg . he has taken it this am and his bp is still about 174/80. he states he feels he may have a sinus infection, he is having discomfort in his forehaed. will discuss with dr Verdis Prime, RN  August 04, 2010 8:55 AM   Additional Follow-up for Phone Call Additional follow up Details #1::        discussed with dr Eden Emms, pt instructed to increase losartan to 100mg  once daily. he will call his primary rtegarding his sinus issues. he will cont to track his bp and let us know of any problems. Deliah Goody, RN  August 04, 2010 9:06 AM

## 2010-08-09 ENCOUNTER — Telehealth: Payer: Self-pay | Admitting: Cardiovascular Disease

## 2010-08-10 ENCOUNTER — Telehealth: Payer: Self-pay | Admitting: Cardiovascular Disease

## 2010-08-11 ENCOUNTER — Encounter: Payer: Self-pay | Admitting: Physician Assistant

## 2010-08-11 ENCOUNTER — Ambulatory Visit (INDEPENDENT_AMBULATORY_CARE_PROVIDER_SITE_OTHER): Payer: Medicare PPO | Admitting: Physician Assistant

## 2010-08-11 DIAGNOSIS — I10 Essential (primary) hypertension: Secondary | ICD-10-CM

## 2010-08-12 ENCOUNTER — Telehealth (INDEPENDENT_AMBULATORY_CARE_PROVIDER_SITE_OTHER): Payer: Self-pay | Admitting: *Deleted

## 2010-08-14 ENCOUNTER — Telehealth: Payer: Self-pay | Admitting: Cardiovascular Disease

## 2010-08-17 NOTE — Assessment & Plan Note (Addendum)
Summary: elevated bp/dm   Visit Type:  Follow-up Referring Provider:  Dr Eden Emms Primary Provider:  Dr. Orrin Brigham   CC:  high BP.  History of Present Illness: Primary Cardiologist:  Dr. Charlton Haws  Kenneth Jennings is a 70 yo male with AFib/Flutter, HTN, DM2, HLP and carotid stenosis.  He had a left VATS for chronic left pleural effusion in Dec 2011.  He has significant LAE and is not a candidate for AFib ablation.  He is on coumadin.  He has not been able to take beta blockers or CCB 2/2 to bradycardia.  Dr. Johney Frame had seen him recently and he is not interested in a pacemaker at this time.  He called in recently with concerns about his BP.  He was apparently put on amlodipine.  He had his losartan increased, but after calling in with further concerns, this was changed to Benicar on 08/09/10.  He returns today to follow up on his BP.  He seems to think that the Benicar is making his blood pressure go up.  He brought his blood pressure cuff in with him today.  It is reading about 20-30 points higher than our manual cuff.  He denies chest pain, shortness of breath, syncope.  He does have some headaches, although he feels that this could be from sinus congestion.  He feels flushed after taking the Benicar.  He denies any rash or difficulty with breathing.  He stopped taking his Lasix.   Current Medications (verified): 1)  Benicar 20 Mg Tabs (Olmesartan Medoxomil) .... Take One Tablet By Mouth Daily 2)  Furosemide 40 Mg Tabs (Furosemide) .Marland Kitchen.. 1  Tablets By Mouth Once A Day 3)  Warfarin Sodium 10 Mg Tabs (Warfarin Sodium) .... As Directed 4)  Glimepiride 4 Mg Tabs (Glimepiride) .Marland Kitchen.. 1 Tab By Mouth Two Times A Day 5)  Lantus 100 Unit/ml Soln (Insulin Glargine) .... 30 Units Daily 6)  Zolpidem Tartrate 10 Mg Tabs (Zolpidem Tartrate) .... As Needed 7)  Amiodarone Hcl 200 Mg Tabs (Amiodarone Hcl) .... Take 1 Tablet By Mouth Once A Day 8)  Vitamin B Complex-C   Caps (B Complex-C) .... Once Daily 9)   Vitamin C 500 Mg  Tabs (Ascorbic Acid) .... Once Daily 10)  Magnesium 500 Mg Tabs (Magnesium) .... 4 Daily 11)  Sea Iodine .... Once Daily 12)  Cinnamon 375mg  .... Two Times A Day 13)  Vitamin D3 1000 Unit Tabs (Cholecalciferol) .Marland Kitchen.. 1 or 2 Daily 14)  Ra Krill Oil 500 Mg Caps (Krill Oil) .... Once Daily  Allergies (verified): No Known Drug Allergies  Past History:  Past Medical History: Last updated: 07/10/2010 HYPERTENSION (ICD-401.9) Persistent ATRIAL FIBRILLATION (ICD-427.31) CEREBROVASCULAR ACCIDENT 10 years ago OBESITY (ICD-278.00) DIABETES MELLITUS, TYPE II (ICD-250.00) SOMNOLENCE (ICD-780.09) HYPERCHOLESTEROLEMIA (ICD-272.0) INSOMNIA (ICD-780.52) chronic pleural effusion Possibly sleep apnea (reports being told to wear CPAP previously) deviated nasal septum  Past Surgical History: Last updated: 07/10/2010 L CEA 2009 VATS with pleurodesis for chronic effusion 12/11  Review of Systems       As per  the HPI.  All other systems reviewed and negative.   Vital Signs:  Patient profile:   70 year old male Height:      72 inches Weight:      261 pounds BMI:     35.53 Pulse rate:   49 / minute BP sitting:   154 / 64  (left arm) Cuff size:   large  Vitals Entered By: Hardin Negus, RMA (August 11, 2010  8:29 AM)  Physical Exam  General:  Well nourished, well developed, in no acute distress HEENT: normal Neck: no JVD Cardiac:  normal S1, S2; RRR; no murmur Lungs:  clear to auscultation bilaterally, no wheezing, rhonchi or rales Abd: soft, nontender, no hepatomegaly; ? bruit Ext: no edema Skin: warm and dry Neuro:  CNs 2-12 intact, no focal abnormalities noted    EKG  Procedure date:  08/11/2010  Findings:      sinus bradycardia Heart rate 49 Normal axis No acute changes  Impression & Recommendations:  Problem # 1:  HYPERTENSION (ICD-401.9) His BP cuff is 20-30 points off.  I have advised him to get a new cuff.  He is concerned about the  Benicar making his BP go up and states he feels flushed when he takes it.  He has limited options for BP control with his bradycardia.  I would not want to put him on clonidine.  We may be forced to put him on hydralazine if he cannot tolerate the Benicar.  I advised him to resume the lasix.  The combination of the 2 meds should help bring his BP down.  Check bmet in 7-10 days (he has a h/o renal insuff.).  Check a renal artery ultrasound to r/o RAS.  BP check at the time of his labs.  Bring in new cuff for comparison.  F/u with PN as directed.  Give info. on low salt diet. . . his diet sounds suspect.  Problem # 2:  BRADYCARDIA-TACHYCARDIA SYNDROME (ICD-427.81) Tol. current HR.  No symptoms.    Problem # 3:  ATRIAL FIBRILLATION (ICD-427.31) Maintaining NSR.  Orders: EKG w/ Interpretation (93000)  Other Orders: Renal Artery Duplex (Renal Artery Duplex)  Patient Instructions: 1)  Keep appointment that you already have scheduled to see Dr. Eden Emms later this month (March). 2)  Your physician has requested that you limit the intake of sodium (salt) in your diet to four grams daily. Please see [facility] handout. 3)  Your physician has requested that you have a renal artery duplex. During this test, an ultrasound is used to evaluate blood flow to the kidneys. Allow one hour for this exam. Do not eat after midnight the day before and avoid carbonated beverages. Take your medications as you usually do. 4)  Your physician recommends that you return for lab work in: 08/21/10 to have lab work done [employer]) and have your blood pressure rechecked with the nurse that day. 5)  Your physician recommends that you continue on your current medications as directed. Please refer to the Current Medication list given to you today. CONTINUE TO TAKE YOUR BENICAR AND START BACK ON YOUR LASIX AS PER Ritter Helsley, PA-C. 6)  You have been asked to get a new blood pressure cuff for your cuff at home. and please bring this with you  when you come in on 08/21/10.

## 2010-08-17 NOTE — Progress Notes (Signed)
Summary: high blood pressure  Phone Note Call from Patient Call back at Home Phone [phone]   Caller: Patient Reason for Call: Talk to Nurse Summary of Call: pt states his blood pressure this morning 172/83 last night it went up 205/97 after taking the meds. no chest pain no sob. pt does states he a little dizzy. Initial call taken by: Roe Coombs,  August 10, 2010 9:03 AM  Follow-up for Phone Call        spoke with pt, he cont to feel flush and have elevated bp. appt made for pt to see scott wearver tomorrow at 8:30a to discuss options. pt was told to bring all his supplements and meds with him tomorrow. he questioned if he needed to take another benicar this am. per scott weaver the pt should wait and take his normal dose tonight and avoid salt, caffine and take it easy today. pt voiced understanding. Deliah Goody, RN  August 10, 2010 10:32 AM     Z

## 2010-08-17 NOTE — Progress Notes (Addendum)
Summary: Cardiology Phone Note - Hypertension  Phone Note Call from Patient   Reason for Call: Talk to Doctor Summary of Call: Returned call from pt concerning elevated BP.  He states that last evening his BP was >200 after taking his Benicar and went to the ER to be evaluated.  He said they did not change any medication but ran some test that were ok.  He was released.  Today he is again nervous about taking his Benicar as he feel that this continues to elevate his pressures and it makes him extremely flushed.  He denies any CP, SOB or dizziness.  In reading past notes it appears antihypertensives are limited secondary to bradycardia.  He also says he gets HA with multiple medications.  I asked pt why Benicar was added recently and he says because the losartan was felt to be giving him headaches.  He says it worked better on his BP though.  I have asked the pt to stop taking benicar and retry his losartan.  He is very agreeable to this.  He will cont. all other meds at this time.  He still has losartan pills at home.  He does not have a follow up with PN until 3/27 but I have asked him to keep a log of his BP's for this appt.  If he has adverse reactions or pressures remain elevated he is to call and schedule a follow up appt sooner.  Pt voiced understanding.    Initial call taken by: Robbi Garter NP-PA,  August 12, 2010 4:38 PM

## 2010-08-17 NOTE — Progress Notes (Signed)
Summary: c/o b/p high 180/88  Phone Note Call from Patient Call back at Home Phone [phone]   Caller: Patient Reason for Call: Talk to Nurse Summary of Call: c/o b/p  high - 180/88. no chestpain. forehead is hot.  Initial call taken by: Lorne Skeens,  August 09, 2010 11:20 AM  Follow-up for Phone Call        spoke with pt, he had a great day yesterday and then last night his bp was elevated. he took his losartan and later his bp was still elevated so he took another losartan at midnight. he woke at 3am wirth his heart pounding and also at 5 am. he is having headache and flushing from the losartan and feels it may need to be changed. will discuss with dr Verdis Prime, RN  August 09, 2010 12:03 PM   Additional Follow-up for Phone Call Additional follow up Details #1::        discussed with dr Eden Emms, will try benicar 20mg  once daily. pt aware. he will let me know how he is doing. Deliah Goody, RN  August 09, 2010 1:34 PM     New/Updated Medications: BENICAR 20 MG TABS (OLMESARTAN MEDOXOMIL) Take one tablet by mouth daily Prescriptions: BENICAR 20 MG TABS (OLMESARTAN MEDOXOMIL) Take one tablet by mouth daily  #30 x 12   Entered by:   Deliah Goody, RN   Authorized by:   Colon Branch, MD, [facility]   Signed by:   Deliah Goody, RN on 08/09/2010   Method used:   Electronically to        [facility] [phone]* (retail)       [address]       [place], [place]  [address]       Ph: [phone]       Fax: [phone]   RxID:   [id number]

## 2010-08-21 ENCOUNTER — Other Ambulatory Visit: Payer: Medicare PPO

## 2010-08-22 ENCOUNTER — Encounter: Payer: Self-pay | Admitting: Cardiology

## 2010-08-22 LAB — BLOOD GAS, ARTERIAL
Drawn by: 181601
FIO2: 0.21 %
Patient temperature: 98.6
pCO2 arterial: 43.7 mmHg (ref 35.0–45.0)
pH, Arterial: 7.446 (ref 7.350–7.450)

## 2010-08-22 LAB — BASIC METABOLIC PANEL
BUN: 21 mg/dL (ref 6–23)
BUN: 18 mg/dL (ref 6–23)
CO2: 25 mEq/L (ref 19–32)
Calcium: 8.7 mg/dL (ref 8.4–10.5)
Calcium: 8.8 mg/dL (ref 8.4–10.5)
Calcium: 8.5 mg/dL (ref 8.4–10.5)
Calcium: 9 mg/dL (ref 8.4–10.5)
Chloride: 101 mEq/L (ref 96–112)
Chloride: 96 mEq/L (ref 96–112)
Creatinine, Ser: 1.34 mg/dL (ref 0.4–1.5)
Creatinine, Ser: 1.49 mg/dL (ref 0.4–1.5)
Creatinine, Ser: 1.58 mg/dL — ABNORMAL HIGH (ref 0.4–1.5)
Creatinine, Ser: 1.71 mg/dL — ABNORMAL HIGH (ref 0.4–1.5)
Creatinine, Ser: 1.5 mg/dL (ref 0.4–1.5)
GFR calc Af Amer: 53 mL/min — ABNORMAL LOW (ref >60)
GFR calc Af Amer: 56 mL/min — ABNORMAL LOW (ref >60)
GFR calc Af Amer: >60 mL/min (ref >60)
GFR calc non Af Amer: 47 mL/min — ABNORMAL LOW (ref >60)
GFR calc non Af Amer: 53 mL/min — ABNORMAL LOW (ref >60)
GFR calc non Af Amer: 40 mL/min — ABNORMAL LOW (ref >60)
GFR calc non Af Amer: 42 mL/min — ABNORMAL LOW (ref >60)
Glucose, Bld: 136 mg/dL — ABNORMAL HIGH (ref 70–99)
Glucose, Bld: 86 mg/dL (ref 70–99)
Glucose, Bld: 110 mg/dL — ABNORMAL HIGH (ref 70–99)
Potassium: 3.2 mEq/L — ABNORMAL LOW (ref 3.5–5.1)
Sodium: 133 mEq/L — ABNORMAL LOW (ref 135–145)
Sodium: 135 mEq/L (ref 135–145)

## 2010-08-22 LAB — CBC
HCT: 39 % (ref 39.0–52.0)
HCT: 46.8 % (ref 39.0–52.0)
HCT: 41.8 % (ref 39.0–52.0)
Hemoglobin: 13.4 g/dL (ref 13.0–17.0)
Hemoglobin: 13.6 g/dL (ref 13.0–17.0)
Hemoglobin: 14.1 g/dL (ref 13.0–17.0)
MCH: 32.1 pg (ref 26.0–34.0)
MCH: 32.8 pg (ref 26.0–34.0)
MCHC: 34.4 g/dL (ref 30.0–36.0)
MCHC: 35 g/dL (ref 30.0–36.0)
MCHC: 33.7 g/dL (ref 30.0–36.0)
MCHC: 34.3 g/dL (ref 30.0–36.0)
MCHC: 35.6 g/dL (ref 30.0–36.0)
MCV: 93.2 fL (ref 78.0–100.0)
MCV: 93.5 fL (ref 78.0–100.0)
Platelets: 213 10*3/uL (ref 150–400)
Platelets: 192 10*3/uL (ref 150–400)
RBC: 4.17 MIL/uL — ABNORMAL LOW (ref 4.22–5.81)
RBC: 4.71 MIL/uL (ref 4.22–5.81)
RBC: 4.43 MIL/uL (ref 4.22–5.81)
RDW: 12.9 % (ref 11.5–15.5)
RDW: 13.2 % (ref 11.5–15.5)
RDW: 13.4 % (ref 11.5–15.5)
RDW: 13.6 % (ref 11.5–15.5)
WBC: 12.1 10*3/uL — ABNORMAL HIGH (ref 4.0–10.5)
WBC: 7.1 10*3/uL (ref 4.0–10.5)
WBC: 9.2 10*3/uL (ref 4.0–10.5)
WBC: 9.9 10*3/uL (ref 4.0–10.5)

## 2010-08-22 LAB — TYPE AND SCREEN: ABO/RH(D): A POS

## 2010-08-22 LAB — BODY FLUID CULTURE
Culture: NO GROWTH
Culture: NO GROWTH

## 2010-08-22 LAB — PROTIME-INR
INR: 1.28 (ref 0.00–1.49)
INR: 1.37 (ref 0.00–1.49)
INR: 1.38 (ref 0.00–1.49)
INR: 1.31 (ref 0.00–1.49)
INR: 2.13 — ABNORMAL HIGH (ref 0.00–1.49)
Prothrombin Time: 15 seconds (ref 11.6–15.2)
Prothrombin Time: 16.2 seconds — ABNORMAL HIGH (ref 11.6–15.2)
Prothrombin Time: 17.1 seconds — ABNORMAL HIGH (ref 11.6–15.2)
Prothrombin Time: 17.2 seconds — ABNORMAL HIGH (ref 11.6–15.2)
Prothrombin Time: 18.2 seconds — ABNORMAL HIGH (ref 11.6–15.2)
Prothrombin Time: 16.5 seconds — ABNORMAL HIGH (ref 11.6–15.2)
Prothrombin Time: 17.3 seconds — ABNORMAL HIGH (ref 11.6–15.2)
Prothrombin Time: 22.6 seconds — ABNORMAL HIGH (ref 11.6–15.2)
Prothrombin Time: 24 seconds — ABNORMAL HIGH (ref 11.6–15.2)

## 2010-08-22 LAB — MRSA PCR SCREENING: MRSA by PCR: NEGATIVE

## 2010-08-22 LAB — COMPREHENSIVE METABOLIC PANEL
ALT: 13 U/L (ref 0–53)
ALT: 24 U/L (ref 0–53)
AST: 15 U/L (ref 0–37)
AST: 32 U/L (ref 0–37)
AST: 24 U/L (ref 0–37)
Alkaline Phosphatase: 75 U/L (ref 39–117)
Alkaline Phosphatase: 63 U/L (ref 39–117)
BUN: 23 mg/dL (ref 6–23)
CO2: 26 mEq/L (ref 19–32)
CO2: 27 mEq/L (ref 19–32)
CO2: 32 mEq/L (ref 19–32)
Chloride: 96 mEq/L (ref 96–112)
Chloride: 98 mEq/L (ref 96–112)
Chloride: 96 mEq/L (ref 96–112)
Creatinine, Ser: 1.52 mg/dL — ABNORMAL HIGH (ref 0.4–1.5)
Creatinine, Ser: 1.89 mg/dL — ABNORMAL HIGH (ref 0.4–1.5)
GFR calc Af Amer: 43 mL/min — ABNORMAL LOW (ref >60)
GFR calc Af Amer: 55 mL/min — ABNORMAL LOW (ref >60)
GFR calc Af Amer: 51 mL/min — ABNORMAL LOW (ref >60)
GFR calc non Af Amer: 35 mL/min — ABNORMAL LOW (ref >60)
GFR calc non Af Amer: 46 mL/min — ABNORMAL LOW (ref >60)
GFR calc non Af Amer: 42 mL/min — ABNORMAL LOW (ref >60)
Glucose, Bld: 162 mg/dL — ABNORMAL HIGH (ref 70–99)
Glucose, Bld: 172 mg/dL — ABNORMAL HIGH (ref 70–99)
Potassium: 4.5 mEq/L (ref 3.5–5.1)
Potassium: 4 mEq/L (ref 3.5–5.1)
Sodium: 138 mEq/L (ref 135–145)
Total Bilirubin: 1 mg/dL (ref 0.3–1.2)
Total Bilirubin: 1.4 mg/dL — ABNORMAL HIGH (ref 0.3–1.2)
Total Bilirubin: 1.6 mg/dL — ABNORMAL HIGH (ref 0.3–1.2)

## 2010-08-22 LAB — GLUCOSE, CAPILLARY
Glucose-Capillary: 103 mg/dL — ABNORMAL HIGH (ref 70–99)
Glucose-Capillary: 125 mg/dL — ABNORMAL HIGH (ref 70–99)
Glucose-Capillary: 145 mg/dL — ABNORMAL HIGH (ref 70–99)
Glucose-Capillary: 162 mg/dL — ABNORMAL HIGH (ref 70–99)
Glucose-Capillary: 180 mg/dL — ABNORMAL HIGH (ref 70–99)
Glucose-Capillary: 206 mg/dL — ABNORMAL HIGH (ref 70–99)
Glucose-Capillary: 210 mg/dL — ABNORMAL HIGH (ref 70–99)
Glucose-Capillary: 214 mg/dL — ABNORMAL HIGH (ref 70–99)
Glucose-Capillary: 105 mg/dL — ABNORMAL HIGH (ref 70–99)
Glucose-Capillary: 168 mg/dL — ABNORMAL HIGH (ref 70–99)
Glucose-Capillary: 172 mg/dL — ABNORMAL HIGH (ref 70–99)
Glucose-Capillary: 177 mg/dL — ABNORMAL HIGH (ref 70–99)
Glucose-Capillary: 177 mg/dL — ABNORMAL HIGH (ref 70–99)
Glucose-Capillary: 216 mg/dL — ABNORMAL HIGH (ref 70–99)
Glucose-Capillary: 240 mg/dL — ABNORMAL HIGH (ref 70–99)

## 2010-08-22 LAB — DIFFERENTIAL
Basophils Absolute: 0.2 10*3/uL — ABNORMAL HIGH (ref 0.0–0.1)
Basophils Relative: 2 % — ABNORMAL HIGH (ref 0–1)
Eosinophils Absolute: 0.2 10*3/uL (ref 0.0–0.7)
Neutrophils Relative %: 73 % (ref 43–77)

## 2010-08-22 LAB — POCT I-STAT 3, ART BLOOD GAS (G3+)
Patient temperature: 100.1
pCO2 arterial: 47.1 mmHg — ABNORMAL HIGH (ref 35.0–45.0)
pH, Arterial: 7.386 (ref 7.350–7.450)

## 2010-08-22 LAB — BODY FLUID CELL COUNT WITH DIFFERENTIAL
Eos, Fluid: 2 %
Eos, Fluid: 2 %
Lymphs, Fluid: 88 %
Lymphs, Fluid: 87 %
Monocyte-Macrophage-Serous Fluid: 10 % — ABNORMAL LOW (ref 50–90)
Neutrophil Count, Fluid: 5 % (ref 0–25)
Neutrophil Count, Fluid: 1 % (ref 0–25)
Total Nucleated Cell Count, Fluid: 5250 cu mm — ABNORMAL HIGH (ref 0–1000)
Total Nucleated Cell Count, Fluid: 6307 cu mm — ABNORMAL HIGH (ref 0–1000)

## 2010-08-22 LAB — LIPID PANEL
Cholesterol: 170 mg/dL (ref 0–200)
LDL Cholesterol: 94 mg/dL (ref 0–99)
VLDL: 37 mg/dL (ref 0–40)

## 2010-08-22 LAB — LACTATE DEHYDROGENASE, PLEURAL OR PERITONEAL FLUID: LD, Fluid: 313 U/L

## 2010-08-22 LAB — ALBUMIN, FLUID (OTHER): Albumin, Fluid: 2.8 g/dL

## 2010-08-22 LAB — AFB CULTURE WITH SMEAR (NOT AT ARMC): Acid Fast Smear: NONE SEEN

## 2010-08-22 LAB — CARDIAC PANEL(CRET KIN+CKTOT+MB+TROPI)
CK, MB: 1 ng/mL (ref 0.3–4.0)
Relative Index: 0.8 (ref 0.0–2.5)
Relative Index: 0.9 (ref 0.0–2.5)
Relative Index: INVALID (ref 0.0–2.5)
Total CK: 99 U/L (ref 7–232)

## 2010-08-22 LAB — GLUCOSE, RANDOM: Glucose, Bld: 152 mg/dL — ABNORMAL HIGH (ref 70–99)

## 2010-08-22 LAB — HEPATIC FUNCTION PANEL
ALT: 20 U/L (ref 0–53)
Alkaline Phosphatase: 53 U/L (ref 39–117)
Bilirubin, Direct: 0.3 mg/dL (ref 0.0–0.3)
Indirect Bilirubin: 1.2 mg/dL — ABNORMAL HIGH (ref 0.3–0.9)
Total Protein: 6.6 g/dL (ref 6.0–8.3)

## 2010-08-22 LAB — LACTATE DEHYDROGENASE: LDH: 152 U/L (ref 94–250)

## 2010-08-22 LAB — APTT: aPTT: 33 seconds (ref 24–37)

## 2010-08-22 LAB — GLUCOSE, SEROUS FLUID: Glucose, Fluid: 180 mg/dL

## 2010-08-22 LAB — CHOLESTEROL, BODY FLUID: Cholesterol, Fluid: 94 mg/dL

## 2010-08-22 LAB — FUNGUS CULTURE W SMEAR: Fungal Smear: NONE SEEN

## 2010-08-22 LAB — URINALYSIS, ROUTINE W REFLEX MICROSCOPIC
Bilirubin Urine: NEGATIVE
Glucose, UA: 500 mg/dL — AB
Hgb urine dipstick: NEGATIVE
Specific Gravity, Urine: 1.009 (ref 1.005–1.030)
pH: 6 (ref 5.0–8.0)

## 2010-08-22 LAB — SURGICAL PCR SCREEN
MRSA, PCR: NEGATIVE
Staphylococcus aureus: NEGATIVE

## 2010-08-22 LAB — PREPARE FRESH FROZEN PLASMA

## 2010-08-22 LAB — TSH: TSH: 3.953 u[IU]/mL (ref 0.350–4.500)

## 2010-08-22 LAB — PH, BODY FLUID

## 2010-08-22 LAB — TRIGLYCERIDES: Triglycerides: 105 mg/dL (ref <150)

## 2010-08-22 LAB — AMYLASE, BODY FLUID

## 2010-08-22 LAB — POCT CARDIAC MARKERS: CKMB, poc: <1 ng/mL — ABNORMAL LOW (ref 1.0–8.0)

## 2010-08-22 LAB — ABO/RH: ABO/RH(D): A POS

## 2010-08-22 LAB — PROTEIN, BODY FLUID: Total protein, fluid: 5.1 g/dL

## 2010-08-22 NOTE — Progress Notes (Signed)
Summary: PT WAS SEEN IN ED   Phone Note Call from Patient Call back at Home Phone [phone]   Caller: Patient Reason for Call: Talk to Nurse, Talk to Doctor Summary of Call: PT WAS IN ED FRIDAY 3/2 DUE TO ELEVATED B/P WHICH WAS CAUSED BY BENICAR PT IS TAKING PT TALK TO ON CALL PA AND MEDICATION WAS CAHNGED AND PT HAD CHEST X-RAY DONE AND WAS WONDERING HOWIT WAS CAUSE HE WAS TOLD IT WAS A SPOT ON HIS LUNG Initial call taken by: Omer Jack,  August 14, 2010 9:29 AM  Follow-up for Phone Call        Patient's BP was 205/99 and he went to [facility]. He has stopped taking Benicar (patient's request) as of his discharge and started taking Losartan 25mg  in the am and 50mg  in the evening.  He is feeling better. He will continue to monitor his BP and call us if it remains elevated. Patient states he had a chest xray @ [facility] and they said he had a spot on his lung. I advised him to have the hospital fax those results to Dr. Scheryl Darter office and f/u with them. He agrees. Whitney Maeola Sarah RN  August 14, 2010 11:00 AM  Follow-up by: Whitney Maeola Sarah RN,  August 14, 2010 11:00 AM

## 2010-08-29 LAB — CBC
Hemoglobin: 13.2 g/dL (ref 13.0–17.0)
MCHC: 35.9 g/dL (ref 30.0–36.0)
RBC: 3.92 MIL/uL — ABNORMAL LOW (ref 4.22–5.81)

## 2010-08-29 LAB — BASIC METABOLIC PANEL
CO2: 22 mEq/L (ref 19–32)
Calcium: 9.2 mg/dL (ref 8.4–10.5)
Chloride: 103 mEq/L (ref 96–112)
GFR calc Af Amer: 47 mL/min — ABNORMAL LOW (ref >60)
Sodium: 136 mEq/L (ref 135–145)

## 2010-08-29 LAB — PROTIME-INR: INR: 2.33 — ABNORMAL HIGH (ref 0.00–1.49)

## 2010-09-05 ENCOUNTER — Encounter: Payer: Self-pay | Admitting: Cardiovascular Disease

## 2010-09-05 ENCOUNTER — Ambulatory Visit (INDEPENDENT_AMBULATORY_CARE_PROVIDER_SITE_OTHER): Payer: Medicare PPO | Admitting: Cardiovascular Disease

## 2010-09-05 VITALS — BP 150/80 | HR 54 | Resp 12 | Wt 253.0 lb

## 2010-09-05 DIAGNOSIS — I4891 Unspecified atrial fibrillation: Secondary | ICD-10-CM

## 2010-09-05 DIAGNOSIS — J90 Pleural effusion, not elsewhere classified: Secondary | ICD-10-CM

## 2010-09-05 DIAGNOSIS — I495 Sick sinus syndrome: Secondary | ICD-10-CM

## 2010-09-05 DIAGNOSIS — E119 Type 2 diabetes mellitus without complications: Secondary | ICD-10-CM

## 2010-09-05 DIAGNOSIS — T462X1A Poisoning by other antidysrhythmic drugs, accidental (unintentional), initial encounter: Secondary | ICD-10-CM

## 2010-09-05 DIAGNOSIS — E7800 Pure hypercholesterolemia, unspecified: Secondary | ICD-10-CM

## 2010-09-05 DIAGNOSIS — I10 Essential (primary) hypertension: Secondary | ICD-10-CM

## 2010-09-05 DIAGNOSIS — I6529 Occlusion and stenosis of unspecified carotid artery: Secondary | ICD-10-CM

## 2010-09-05 MED ORDER — AMIODARONE HCL 100 MG PO TABS: 100.0000 mg | ORAL_TABLET | Freq: Every day | ORAL | Status: DC

## 2010-09-05 NOTE — Assessment & Plan Note (Signed)
Will see how he dose with better sleep.  Side effects to multiple meds.  May consider aldomet, hydralazine in future

## 2010-09-05 NOTE — Assessment & Plan Note (Signed)
Maint NSR on low dose amiodarone.  Continue at lower dose to see if insomnia improved and guard against lung issues.  Continue coumadin.  Dose may need to be adjusted by Dr Valda Favia

## 2010-09-05 NOTE — Assessment & Plan Note (Signed)
Recent hypoglycemic episode  Oral hypoglycemic stopped and lantus decreased.

## 2010-09-05 NOTE — Assessment & Plan Note (Signed)
F/U duplex in 6 months  No recurrent TIA's on coumadin and asa

## 2010-09-05 NOTE — Assessment & Plan Note (Signed)
Dyspnea stable.  S/P VATS  F/U Dr Edwyna Shell  Recent CT ok.  F/U amiodarone labs and DLCO in 3 months

## 2010-09-05 NOTE — Progress Notes (Signed)
Kenneth Jennings is a 70 yo male with AFib/Flutter, HTN, DM2, HLP and carotid stenosis.  He had a left VATS for chronic left pleural effusion in Dec 2011.  He has significant LAE and is not a candidate for AFib ablation.  He is on coumadin.  He has not been able to take beta blockers or CCB 2/2 to bradycardia.  Dr. Johney Frame had seen him recently and he is not interested in a pacemaker at this time.  He called in recently with concerns about his BP.  He was apparently put on amlodipine.  He had his losartan increased, but after calling in with further concerns, this was changed to Benicar on 08/09/10.  He returns today to follow up on his BP.  He seems to think that the Benicar is making his blood pressure go up.  Reviewed two recent ER visits for elevated BP.  Agree with stopping Benicar  He denies chest pain, shortness of breath, syncope.  He does have some headaches, although he feels that this could be from sinus congestion.  He feels flushed after taking the Benicar.  He denies any rash or difficulty with breathing.  He  Is still taking his Lasix  Also has had left VATS with [facility].  CXR  With ? Lung nodule but F/U CT in [facility] no lesion.  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  Will do DLCO in 3 months.    Has insomnia ? Made worse by amiodarone  After long discussion decided to lowere amiodarone and not add any BP meds at this time as he thinks his lack of sleep makes his BP high.  Will check his PT in 2 weeks with Dr Valda Favia since he may need higher dose with less amiodarone.  ROS: Denies fever, malais, weight loss, blurry vision, decreased visual acuity, cough, sputum, SOB, hemoptysis, pleuritic pain, palpitaitons, heartburn, abdominal pain, melena, lower extremity edema, claudication, or rash.   General: Affect appropriate Healthy:  appears stated age HEENT: normal Neck supple with no adenopathy JVP normal left  bruits no thyromegaly Lungs clear with no wheezing and good  diaphragmatic motion Heart:  S1/S2 no murmur,rub, gallop or click PMI normal Abdomen: benighn, BS positve, no tenderness, no AAA no bruit.  No HSM or HJR Distal pulses intact with no bruits Trace bilagteral  edema Neuro non-focal Skin warm and dry No muscular weakness   Current Outpatient Prescriptions  Medication Sig Dispense Refill  . amiodarone (PACERONE) 100 MG tablet Take 1 tablet (100 mg total) by mouth daily.  30 tablet  12  . Ascorbic Acid (VITAMIN C) 500 MG tablet Take 500 mg by mouth daily.        . B Complex Vitamins (VITAMIN B COMPLEX PO) 1 tab po qd       . Cholecalciferol (VITAMIN D3) 1000 UNITS CAPS 1 or 2 tabs po qd       . CINNAMON PO 1 tab po bid       . furosemide (LASIX) 40 MG tablet Take 40 mg by mouth daily.        . insulin glargine (LANTUS OPTICLIK) 100 UNIT/ML injection Inject into the skin. 20 units daily      . KRILL OIL PO 1 tab po qd       . warfarin (COUMADIN) 10 MG tablet As diretced       . Zolpidem Tartrate 10 MG SUBL Prn       . DISCONTD: amiodarone (PACERONE) 200 MG tablet  Take 200 mg by mouth daily.        Marland Kitchen DISCONTD: losartan (COZAAR) 50 MG tablet Take 50 mg by mouth 2 (two) times daily.        Marland Kitchen DISCONTD: glimepiride (AMARYL) 4 MG tablet Take 4 mg by mouth 2 (two) times daily.        Marland Kitchen DISCONTD: olmesartan (BENICAR) 20 MG tablet Take 20 mg by mouth daily.          Allergies  Benicar  Electrocardiogram:  Assessment and Plan

## 2010-09-05 NOTE — Assessment & Plan Note (Signed)
Continue diet Rx  And cinnamon  F/U labs Dr Valda Favia

## 2010-09-05 NOTE — Patient Instructions (Signed)
DECREASE AMIODARONE TO 200MG  TAKE ONE HALF TABLET ONCE DAILY Your physician recommends that you schedule a follow-up appointment in: 6-8 WEEKS HAVE A PROTIME CHECKED WITH DR Jiles Crocker NEXT WEEK

## 2010-10-24 NOTE — Letter (Signed)
May 03, 2010   Kalman Shan, MD  [address] [place] [address]  [place], [place] [address]   Re:  TRINO, HIGINBOTHAM              DOB:  [birth year]   Dear Dr. Marchelle Gearing:   The patient was seen in the office today.  This is a 70 year old white  male who has had increasing shortness of breath, was found to have a  left pleural effusion, underwent a left thoracentesis.  Labs revealed an  exudative study.  CT scan showed intermediate probability for pulmonary  emboli.  He has been on Coumadin for atrial fibrillation.  As I told,  these were negative.  He has a normal BNP.  He has had some  hypothyroidism and has been placed on Synthroid for decreased TSH.  His  x-ray showed a moderate-to-severe dilated left atrium with mild LVH with  an ejection fraction of 25%.  His weight went up 11 pounds.  He had some  leg swelling.  He was started on Lasix, and has had slight improvement.  He is referred here for evaluation of his left pleural effusion.   MEDICATIONS:  Diltiazem, furosemide, warfarin,  Lantus, doxycycline,  Levothyroxine, metoprolol, and Celebrex.   He has no known allergies.   He has had some previous carotid surgery, atrial fibrillation,  hypertension.  He had a history of cerebrovascular accident, diabetes  mellitus type 2, hyperglyceridemia.   SOCIAL HISTORY:  He does not smoke or drink.  He is married and retired  and raises 2 grandchildren.  He did smoke until recently.   REVIEW OF SYSTEMS:  CARDIAC:  See history of present illness.  PULMONARY:  He has had no hemoptysis or wheezing.  GI:  No nausea or vomiting, constipation, or diarrhea.  GU:  No kidney disease, dysuria, or frequent urination.  VASCULAR:  No claudication, DVT or TIAS.  NEUROLOGIC:  No dizziness, headaches, blackouts, or seizures.  MUSCULOSKELETAL:  Arthritis.  PSYCHIATRIC:  No depression or nervousness.  EYE/ENT:  No change in his eyesight or hearing.  HEMATOLOGIC:  On Coumadin.  No problems with  anemia.   PHYSICAL EXAMINATION:  General:  Slightly obese, Caucasian male, not in  acute distress.  His blood pressure was 157/65, pulse 52, respirations  irregularly irregular, respirations are 18, sats were 92%.  Head, Eyes,  Ears, Nose, Throat:  Unremarkable.  Neck:  Supple without thyromegaly.  There is no supraclavicular or axillary adenopathy.  Chest:  Decreased  breath sounds on the left.  No wheezing.  Heart:  Irregularly regular  rhythm.  No murmurs.  Abdomen:  Soft with no hepatosplenomegaly.  Bowel  sounds are normal.  Extremities:  Pulses are 1+.  No clubbing.  There is  1+ edema.  Neurologic:  He is oriented x3.  Sensory and motor intact.  Cranial nerves intact.   I discussed the situation with the patient and I recommended that he  have a left VATS with drainage of pleural effusion, pleural biopsy and  insertion of chest tube, Pleurx catheter with talc pleurodesis.  I plan  to do this on December 5th at [facility].   I appreciate the opportunity of seeing the patient.   Sincerely,   [name], M.D.  Electronically Signed   DPB/MEDQ  D:  05/03/2010  T:  05/04/2010  Job:  [id number]

## 2010-10-24 NOTE — Assessment & Plan Note (Signed)
OFFICE VISIT   Kenneth, Jennings  DOB:  [birth year]                                        May 24, 2010  CHART #:  [id number]   The patient comes in today for a 1-week postoperative followup.  He is  status post left VATS with drainage of effusion, pleural biopsy, talc  pleurodesis and insertion of left Pleurx catheter on May 15, 2010.  His postoperative course was uneventful and he was able to be discharged  home on May 18, 2010, in good condition.  Since his discharge home,  his pulmonary status has remained stable.  He does have a history of  atrial fibrillation and atrial flutter and noticed that last night his  heart felt like he was fluttering.  He contacted Dr. Fabio Jennings office and  was asked to take 2 of his diltiazem 120 mg tablets.  He has not noted  the fluttering since that time.  He otherwise has remained stable.  He  has had very little postoperative pain and his breathing has been  unchanged.  He is on chronic Coumadin therapy for which he is followed  by [facility].   PHYSICAL EXAMINATION:  Vital Signs:  Blood pressure is 135/68, pulse of  76 and irregular, O2 saturation 96% on room air.  His incisions have  healed well and chest tube sutures are removed today.  He has left  Pleurx catheter in place and an attempt was made to drain the catheter  today which yielded no fluid.  Dr. Edwyna Jennings subsequently pulled the Pleurx  catheter in the office without difficulty.  He remains in rate  controlled atrial fibrillation.   LABORATORY DATA:  Chest x-ray today is stable with a tiny left  pneumothorax, otherwise no changes.   ASSESSMENT/PLAN:  The patient is doing well status post left VATS  drainage of effusion.  Dr. Edwyna Jennings saw the patient today and reviewed his  films and we will see him back in 2 weeks for follow up with a chest x-  ray or sooner if he has any problems.   Kenneth Jennings, P.A.   GC/MEDQ  D:  05/24/2010   T:  05/25/2010  Job:  [id number]   cc:   Kenneth Jennings. Kenneth Emms, MD, [facility]

## 2010-10-24 NOTE — Letter (Signed)
June 07, 2010   Kalman Shan, MD  [address] [place] [address]  [place], [place] [address]   Re:  Kenneth Jennings, SKORA                  DOB:  17-Dec-1940   Dear Carmin Muskrat:   The patient comes today and his chest x-ray shows recurrence of his  effusion.  His incisions are well healed where he had his Pleurx and his  chest tube.  He has had some mild pain.  We gave him a prescription for  Vicodin 5/500, #60.  Dr. Eden Emms will start him on amiodarone.  His chest  x-ray was stable.  We will see him back again in 6 weeks with another  chest x-ray for a final check.   [name], M.D.  Electronically Signed   DPB/MEDQ  D:  06/07/2010  T:  06/08/2010  Job:  [id number]   cc:   Noralyn Pick. Eden Emms, MD, [facility]

## 2010-10-31 ENCOUNTER — Ambulatory Visit (INDEPENDENT_AMBULATORY_CARE_PROVIDER_SITE_OTHER): Payer: Medicare PPO | Admitting: Cardiovascular Disease

## 2010-10-31 ENCOUNTER — Encounter: Payer: Self-pay | Admitting: Cardiovascular Disease

## 2010-10-31 ENCOUNTER — Other Ambulatory Visit: Payer: Medicare PPO | Admitting: *Deleted

## 2010-10-31 VITALS — BP 152/70 | HR 60 | Ht 72.0 in | Wt 260.0 lb

## 2010-10-31 DIAGNOSIS — R0602 Shortness of breath: Secondary | ICD-10-CM

## 2010-10-31 DIAGNOSIS — I4891 Unspecified atrial fibrillation: Secondary | ICD-10-CM

## 2010-10-31 DIAGNOSIS — E7800 Pure hypercholesterolemia, unspecified: Secondary | ICD-10-CM

## 2010-10-31 DIAGNOSIS — I10 Essential (primary) hypertension: Secondary | ICD-10-CM

## 2010-10-31 DIAGNOSIS — I6529 Occlusion and stenosis of unspecified carotid artery: Secondary | ICD-10-CM

## 2010-10-31 DIAGNOSIS — R0989 Other specified symptoms and signs involving the circulatory and respiratory systems: Secondary | ICD-10-CM

## 2010-10-31 NOTE — Assessment & Plan Note (Signed)
Multiple drug intolerances.  Continue losartin and lasix for now

## 2010-10-31 NOTE — Assessment & Plan Note (Signed)
F/u duplex in 6 months

## 2010-10-31 NOTE — Patient Instructions (Signed)
Schedule an appointment for a carotid doppler in 6 months.  Schedule an appointment for a pulmonary function test in 6 months.  Schedule an appointment for lab in 6 months--liver profile/TSH 427.31 401.9  Your physician wants you to follow-up in: 6 months with Dr Johnsie Cancel 2012). You will receive a reminder letter in the mail two months in advance. If you don't receive a letter, please call our office to schedule the follow-up appointment.

## 2010-10-31 NOTE — Assessment & Plan Note (Signed)
S/P VATS with some paresthesia on left  F/U Dr Edwyna Shell

## 2010-10-31 NOTE — Assessment & Plan Note (Signed)
Maint NSR  Continue low dose amiodarone

## 2010-10-31 NOTE — Assessment & Plan Note (Signed)
Cholesterol is at goal.  Continue current dose of statin and diet Rx.  No myalgias or side effects.  F/U  LFT's in 6 months. Lab Results  Component Value Date   LDLCALC  Value: 94        Total Cholesterol/HDL:CHD Risk Coronary Heart Disease Risk Table                     Men   Women  1/2 Average Risk   3.4   3.3  Average Risk       5.0   4.4  2 X Average Risk   9.6   7.1  3 X Average Risk  23.4   11.0        Use the calculated Patient Ratio above and the CHD Risk Table to determine the patient's CHD Risk.        ATP III CLASSIFICATION (LDL):  <100     mg/dL   Optimal  427-062  mg/dL   Near or Above                    Optimal  130-159  mg/dL   Borderline  376-283  mg/dL   High  >151     mg/dL   Very High 76/16/0737

## 2010-10-31 NOTE — Progress Notes (Signed)
Kenneth Jennings is a 70 yo male with AFib/Flutter, HTN, DM2, HLP and carotid stenosis. He had a left VATS for chronic left pleural effusion in Dec 2011. He has significant LAE and is not a candidate for AFib ablation. He is on coumadin. He has not been able to take beta blockers or CCB 2/2 to bradycardia. Dr. Johney Frame had seen him recently and he is not interested in a pacemaker at this time. He called in recently with concerns about his BP. He was apparently put on amlodipine. He had his losartan increased, but after calling in with further concerns, this was changed to Benicar on 08/09/10. He returns today to follow up on his BP.  He seems to think that the Benicar is making his blood pressure go up. Reviewed two recent ER visits for elevated BP. Agree with stopping Benicar He denies chest pain, shortness of breath, syncope. He does have some headaches, although he feels that this could be from sinus congestion. He feels flushed after taking the Benicar. He denies any rash or difficulty with breathing. He Is still taking his Lasix  Also has had left VATS with [facility]. CXR With ? Lung nodule but F/U CT in [place] no lesion. 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 Will do DLCO in 3 months.  Has insomnia ? Made worse by amiodarone  His PT has been fine on lower dose amiodarone.  Will check labs and PFT;s in 6 months  Had a nice trip to [place] with family but had way too much pizza  ROS: Denies fever, malais, weight loss, blurry vision, decreased visual acuity, cough, sputum, SOB, hemoptysis, pleuritic pain, palpitaitons, heartburn, abdominal pain, melena, lower extremity edema, claudication, or rash.   General: Affect appropriate Healthy:  appears stated age HEENT: normal Neck supple with no adenopathy JVP normal left  bruits no thyromegaly Lungs clear with no wheezing and good diaphragmatic motion S/P left VATS Heart:  S1/S2 no murmur,rub, gallop or click PMI normal Abdomen: benighn, BS  positve, no tenderness, no AAA no bruit.  No HSM or HJR Distal pulses intact with no bruits No edema Neuro non-focal Skin warm and dry No muscular weakness   Current Outpatient Prescriptions  Medication Sig Dispense Refill  . amiodarone (PACERONE) 100 MG tablet Take 1 tablet (100 mg total) by mouth daily.  30 tablet  12  . Ascorbic Acid (VITAMIN C) 500 MG tablet Take 500 mg by mouth daily.        . B Complex Vitamins (VITAMIN B COMPLEX PO) 1 tab po qd       . Cholecalciferol (VITAMIN D3) 1000 UNITS CAPS 1 or 2 tabs po qd       . CINNAMON PO 1 tab po bid       . furosemide (LASIX) 40 MG tablet Take 40 mg by mouth daily.        . insulin glargine (LANTUS OPTICLIK) 100 UNIT/ML injection Inject into the skin. 20 units daily      . KRILL OIL PO 1 tab po qd       . warfarin (COUMADIN) 10 MG tablet As diretced       . Zolpidem Tartrate 10 MG SUBL Prn         Allergies  Benicar  Electrocardiogram:  Assessment and Plan

## 2011-04-14 ENCOUNTER — Emergency Department (INDEPENDENT_AMBULATORY_CARE_PROVIDER_SITE_OTHER): Payer: Medicare PPO

## 2011-04-14 ENCOUNTER — Other Ambulatory Visit: Payer: Self-pay

## 2011-04-14 ENCOUNTER — Encounter (HOSPITAL_BASED_OUTPATIENT_CLINIC_OR_DEPARTMENT_OTHER): Payer: Self-pay | Admitting: *Deleted

## 2011-04-14 ENCOUNTER — Emergency Department (HOSPITAL_BASED_OUTPATIENT_CLINIC_OR_DEPARTMENT_OTHER)
Admission: EM | Admit: 2011-04-14 | Discharge: 2011-04-14 | Disposition: A | Discharge status: Home / Self Care | Payer: Medicare PPO | Attending: Emergency Medicine | Admitting: Emergency Medicine

## 2011-04-14 DIAGNOSIS — R51 Headache: Secondary | ICD-10-CM | POA: Insufficient documentation

## 2011-04-14 DIAGNOSIS — R222 Localized swelling, mass and lump, trunk: Secondary | ICD-10-CM

## 2011-04-14 DIAGNOSIS — R404 Transient alteration of awareness: Secondary | ICD-10-CM

## 2011-04-14 DIAGNOSIS — I10 Essential (primary) hypertension: Secondary | ICD-10-CM | POA: Insufficient documentation

## 2011-04-14 DIAGNOSIS — E871 Hypo-osmolality and hyponatremia: Secondary | ICD-10-CM | POA: Insufficient documentation

## 2011-04-14 DIAGNOSIS — G319 Degenerative disease of nervous system, unspecified: Secondary | ICD-10-CM

## 2011-04-14 DIAGNOSIS — R079 Chest pain, unspecified: Secondary | ICD-10-CM

## 2011-04-14 LAB — URINALYSIS, ROUTINE W REFLEX MICROSCOPIC
Glucose, UA: NEGATIVE mg/dL
Leukocytes, UA: NEGATIVE
pH: 7.5 (ref 5.0–8.0)

## 2011-04-14 LAB — COMPREHENSIVE METABOLIC PANEL
ALT: 18 U/L (ref 0–53)
Alkaline Phosphatase: 74 U/L (ref 39–117)
CO2: 27 mEq/L (ref 19–32)
GFR calc Af Amer: 62 mL/min — ABNORMAL LOW (ref >90)
GFR calc non Af Amer: 54 mL/min — ABNORMAL LOW (ref >90)
Glucose, Bld: 134 mg/dL — ABNORMAL HIGH (ref 70–99)
Potassium: 4.2 mEq/L (ref 3.5–5.1)
Sodium: 126 mEq/L — ABNORMAL LOW (ref 135–145)

## 2011-04-14 LAB — DIFFERENTIAL
Eosinophils Relative: 1 % (ref 0–5)
Lymphocytes Relative: 14 % (ref 12–46)
Lymphs Abs: 1 10*3/uL (ref 0.7–4.0)
Neutrophils Relative %: 78 % — ABNORMAL HIGH (ref 43–77)

## 2011-04-14 LAB — CARDIAC PANEL(CRET KIN+CKTOT+MB+TROPI)
Relative Index: 1.5 (ref 0.0–2.5)
Total CK: 208 U/L (ref 7–232)
Troponin I: <0.3 ng/mL (ref <0.30)

## 2011-04-14 LAB — CBC
Hemoglobin: 13.8 g/dL (ref 13.0–17.0)
MCV: 88.5 fL (ref 78.0–100.0)
Platelets: 206 10*3/uL (ref 150–400)
RBC: 4.27 MIL/uL (ref 4.22–5.81)
WBC: 7.4 10*3/uL (ref 4.0–10.5)

## 2011-04-14 LAB — BASIC METABOLIC PANEL
CO2: 25 mEq/L (ref 19–32)
Calcium: 9 mg/dL (ref 8.4–10.5)
Creatinine, Ser: 1.3 mg/dL (ref 0.50–1.35)

## 2011-04-14 MED ORDER — DIPHENHYDRAMINE HCL 50 MG/ML IJ SOLN
25.0000 mg | Freq: Once | INTRAMUSCULAR | Status: AC
  Administered 2011-04-14: 25 mg via INTRAVENOUS
  Filled 2011-04-14: qty 1

## 2011-04-14 MED ORDER — HYDRALAZINE HCL 20 MG/ML IJ SOLN
5.0000 mg | Freq: Once | INTRAMUSCULAR | Status: AC
  Administered 2011-04-14: 5 mg via INTRAVENOUS
  Filled 2011-04-14: qty 1

## 2011-04-14 MED ORDER — MORPHINE SULFATE 4 MG/ML IJ SOLN
4.0000 mg | Freq: Once | INTRAMUSCULAR | Status: AC
  Administered 2011-04-14: 4 mg via INTRAVENOUS
  Filled 2011-04-14: qty 1

## 2011-04-14 MED ORDER — PROMETHAZINE HCL 25 MG/ML IJ SOLN
25.0000 mg | Freq: Once | INTRAMUSCULAR | Status: AC
  Administered 2011-04-14: 25 mg via INTRAVENOUS
  Filled 2011-04-14: qty 1

## 2011-04-14 MED ORDER — SODIUM CHLORIDE 0.9 % IV BOLUS (SEPSIS): 1000.0000 mL | Freq: Once | INTRAVENOUS | Status: DC

## 2011-04-14 MED ORDER — SODIUM CHLORIDE 0.9 % IV SOLN: INTRAVENOUS | Status: DC

## 2011-04-14 NOTE — ED Provider Notes (Signed)
History     CSN: [id number] Arrival date & time: 04/14/2011  4:29 PM   First MD Initiated Contact with Patient 04/14/11 1653      Chief Complaint  Patient presents with  . Hypertension  . Headache   HPI Kenneth Jennings is a 70 y.o. male Hypertension who presents to the Emergency Department complaining of moderate to severe Headache and hypertension onset this a.m. Patient states that headache woke him up this morning. Localizes headache to the frontal head. Reports associated chest pain. Describes chest pain as a burning. Reports a history of similar symptoms with chest pain. States that current chest pain is worse than usual. Patient also reports mild tingling in his fingers. Denies any visual disturbance, shortness of breath, nausea or sweating. States that he feels really tired. Reports that his Blood pressure is usually in the high 100s. States that his blood pressure was 230/140 which is very different from baseline. Denies a history of a heart attack and reports that it has been a while since he has had a stress test. Reports having a cardiac catheterization in 1992.  Past Medical History  Diagnosis Date  . HTN (hypertension)   . Atrial fibrillation   . Cerebrovascular accident     10 years ago  . Obesity   . Diabetes mellitus   . Somnolence   . Hypercholesteremia   . Insomnia   . Pleural effusion   . Sleep apnea   . Deviated nasal septum     Past Surgical History  Procedure Date  . 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 Lung procedure     Family History  Problem Relation Age of Onset  . Coronary artery disease      History  Substance Use Topics  . Smoking status: Former Smoker    Quit date: 06/12/1999  . Smokeless tobacco: Never Used  . Alcohol Use: Yes     rare      Review of Systems  Constitutional: Negative for diaphoresis.  Respiratory: Negative for shortness of breath.   Cardiovascular: Positive for chest pain.  Gastrointestinal:  Negative for nausea and vomiting.  Neurological: Positive for headaches.       Tingling in the fingers bilaterally  All other systems reviewed and are negative.    Allergies  Benicar and Amlodipine  Home Medications   Current Outpatient Rx  Name Route Sig Dispense Refill  . AMIODARONE HCL 100 MG PO TABS Oral Take 1 tablet (100 mg total) by mouth daily. 30 tablet 12  . VITAMIN C 500 MG PO TABS Oral Take 500 mg by mouth daily.     Marland Kitchen VITAMIN B COMPLEX PO  1 tab po qd     . VITAMIN D3 1000 UNITS PO CAPS Oral Take 1 capsule by mouth daily. 1 or 2 tabs po qd    . CINNAMON PO  1 tab po bid     . CO Q 10 PO Oral Take 1 tablet by mouth daily.      . FUROSEMIDE 40 MG PO TABS Oral Take 40 mg by mouth daily.      . INSULIN GLARGINE 100 UNIT/ML [place] SOLN Subcutaneous Inject 20-30 Units into the skin at bedtime.      Marland Kitchen KRILL OIL PO  1 tab po qd     . PRESCRIPTION MEDICATION Both Eyes Place 1 drop into both eyes at bedtime. Eye drop for glaucoma     . TIMOLOL MALEATE PO Right Eye  Place 1 drop into the right eye 2 (two) times daily.      . WARFARIN SODIUM 10 MG PO TABS Oral Take 10 mg by mouth 3 (three) times a week. As diretced    . WARFARIN SODIUM 4 MG PO TABS Oral Take 8 mg by mouth 4 (four) times a week.      Marland Kitchen ZOLPIDEM TARTRATE 10 MG PO TABS Oral Take 5 mg by mouth at bedtime as needed. For sleep       BP 220/62  Pulse 59  Temp(Src) 98.3 F (36.8 C) (Oral)  Resp 18  Ht 6' (1.829 m)  Wt 260 lb (117.935 kg)  BMI 35.26 kg/m2  SpO2 95%  Physical Exam  Nursing note and vitals reviewed. Constitutional: He is oriented to person, place, and time. He appears well-developed and well-nourished. No distress.       Awake, alert, nontoxic appearance with baseline speech for patient.  HENT:  Head: Normocephalic and atraumatic.  Mouth/Throat: Oropharynx is clear and moist. No oropharyngeal exudate.  Eyes: EOM are normal. Pupils are equal, round, and reactive to light. Right eye exhibits no  discharge. Left eye exhibits no discharge.  Neck: Neck supple.  Cardiovascular: Regular rhythm and intact distal pulses.  Bradycardia present.   No murmur heard. Pulses:      Dorsalis pedis pulses are 2+ on the right side, and 2+ on the left side.       Posterior tibial pulses are 2+ on the right side, and 2+ on the left side.  Pulmonary/Chest: Effort normal and breath sounds normal. No stridor. He has no wheezes. He has no rales. He exhibits no tenderness.  Abdominal: Soft. Bowel sounds are normal. He exhibits no mass. There is no tenderness. There is no rebound.  Musculoskeletal: Normal range of motion. He exhibits no edema and no tenderness.       Baseline ROM, moves extremities with no obvious new focal weakness.  Lymphadenopathy:    He has no cervical adenopathy.  Neurological: He is alert and oriented to person, place, and time. No cranial nerve deficit.       Awake, alert, cooperative and aware of situation; motor strength bilaterally; sensation normal to light touch bilaterally; peripheral visual fields full to confrontation; no facial asymmetry; tongue midline; major cranial nerves appear intact  Skin: Skin is warm and dry. No rash noted. He is not diaphoretic. No erythema.  Psychiatric: He has a normal mood and affect. His behavior is normal.    ED Course  Procedures   DIAGNOSTIC STUDIES: Oxygen Saturation is 98% on room air, normal by my interpretation.    COORDINATION OF CARE: 14:52 - EDP examined patient at bedside and ordered the following Orders Placed This Encounter  Procedures  . DG Chest 2 View  . CT Head Wo Contrast  . CBC  . Differential  . Comprehensive metabolic panel  . Cardiac panel(cret kin+cktot+mb+tropi)  . Protime-INR  . Urinalysis with microscopic  . ED EKG  . Insert peripheral IV  20:10 - EDP recheck on patient. Patient reports feeling better, but complains of persistent headache.   Results for orders placed during the hospital encounter of  04/14/11  CBC      Component Value Range   WBC 7.4  4.0 - 10.5 (K/uL)   RBC 4.27  4.22 - 5.81 (MIL/uL)   Hemoglobin 13.8  13.0 - 17.0 (g/dL)   HCT 62.1 (*) 30.8 - 52.0 (%)   MCV 88.5  78.0 - 100.0 (fL)  MCH 32.3  26.0 - 34.0 (pg)   MCHC 36.5 (*) 30.0 - 36.0 (g/dL)   RDW 16.1  09.6 - 04.5 (%)   Platelets 206  150 - 400 (K/uL)  DIFFERENTIAL      Component Value Range   Neutrophils Relative 78 (*) 43 - 77 (%)   Neutro Abs 5.7  1.7 - 7.7 (K/uL)   Lymphocytes Relative 14  12 - 46 (%)   Lymphs Abs 1.0  0.7 - 4.0 (K/uL)   Monocytes Relative 7  3 - 12 (%)   Monocytes Absolute 0.5  0.1 - 1.0 (K/uL)   Eosinophils Relative 1  0 - 5 (%)   Eosinophils Absolute 0.1  0.0 - 0.7 (K/uL)   Basophils Relative 0  0 - 1 (%)   Basophils Absolute 0.0  0.0 - 0.1 (K/uL)  PROTIME-INR      Component Value Range   Prothrombin Time 21.4 (*) 11.6 - 15.2 (seconds)   INR 1.82 (*) 0.00 - 1.49     Dg Chest 2 View  04/14/2011  *RADIOLOGY REPORT*  Clinical Data: Chest pain  CHEST - 2 VIEW  Comparison: 07/19/2010  Findings: Lung volumes are low.  There is bibasilar atelectasis. Tiny left pleural effusion noted. Cardiopericardial silhouette is at upper limits of normal for size.  Nodular opacity in the left apex is stable since the 07/19/2010 exam but slightly more prominent than 06/07/2010.  IMPRESSION: Low volume film with bibasilar atelectasis.  Nodular density in the left apex appears slightly progressed since 06/07/2010 the relatively stable since 07/19/2010.  Continued follow-up is recommended to ensure this remains stable as neoplasm could have this appearance.  Original Report Authenticated By: ERIC A. MANSELL, M.D.   Ct Head Wo Contrast  04/14/2011  *RADIOLOGY REPORT*  Clinical Data: Hypertension.  Headache.  Somnolence.  CT HEAD WITHOUT CONTRAST  Technique:  Contiguous axial images were obtained from the base of the skull through the vertex without contrast.  Comparison: None  Findings:  The brain shows mild  age related atrophy.  No sign of acute infarction, mass lesion, hemorrhage, hydrocephalus or extra- axial collection.  There is atherosclerotic change of the major vessels at the base of the brain.  Sinuses are clear.  No calvarial abnormality.  IMPRESSION: Age related atrophy.  No acute or focal finding.  Original Report Authenticated By: Thomasenia Sales, M.D.     MDM: Elevated blood pressure with frontal headache, chest tightness.  States compliance with blood pressure medications and no recent changes. Denies any visual changes, nausea, shortness of breath, diaphoresis. States he feels excessively tired.  Cardiac enzymes are negative x2, no EKG changes. Blood pressure has improved as well as headache.  Hyponatremia noted on labs the patient made aware. He'll need recheck of electrolytes this coming Monday for blood pressure recheck as well.   Date: 04/14/2011  Rate: 59  Rhythm: normal sinus rhythm  QRS Axis: normal  Intervals: normal  ST/T Wave abnormalities: normal  Conduction Disutrbances:none  Narrative Interpretation:   Old EKG Reviewed: unchanged   Scribe Attestation I personally performed the services described in this documentation, which was scribed in my presence.  The recorded information has been reviewed and considered.    Glynn Octave, MD 04/14/11 [phone]

## 2011-04-14 NOTE — ED Notes (Signed)
Pt reports bp today was 230/104. Also reports pounding headache 10/10- states woke up with headache. Also reports "burning" across chest

## 2011-04-17 ENCOUNTER — Ambulatory Visit (HOSPITAL_COMMUNITY)
Admission: RE | Admit: 2011-04-17 | Discharge: 2011-04-17 | Disposition: A | Discharge status: Home / Self Care | Payer: Medicare PPO | Source: Ambulatory Visit | Attending: Cardiovascular Disease | Admitting: Cardiovascular Disease

## 2011-04-17 ENCOUNTER — Other Ambulatory Visit (INDEPENDENT_AMBULATORY_CARE_PROVIDER_SITE_OTHER): Payer: Medicare PPO | Admitting: *Deleted

## 2011-04-17 ENCOUNTER — Encounter (INDEPENDENT_AMBULATORY_CARE_PROVIDER_SITE_OTHER): Payer: Medicare PPO | Admitting: *Deleted

## 2011-04-17 DIAGNOSIS — I4891 Unspecified atrial fibrillation: Secondary | ICD-10-CM | POA: Insufficient documentation

## 2011-04-17 DIAGNOSIS — R0989 Other specified symptoms and signs involving the circulatory and respiratory systems: Secondary | ICD-10-CM

## 2011-04-17 DIAGNOSIS — I10 Essential (primary) hypertension: Secondary | ICD-10-CM

## 2011-04-17 DIAGNOSIS — R0602 Shortness of breath: Secondary | ICD-10-CM | POA: Insufficient documentation

## 2011-04-17 DIAGNOSIS — I6529 Occlusion and stenosis of unspecified carotid artery: Secondary | ICD-10-CM

## 2011-04-17 LAB — HEPATIC FUNCTION PANEL
AST: 22 U/L (ref 0–37)
Albumin: 4.2 g/dL (ref 3.5–5.2)
Total Protein: 7.4 g/dL (ref 6.0–8.3)

## 2011-04-17 LAB — TSH: TSH: 5.01 u[IU]/mL (ref 0.35–5.50)

## 2011-04-26 ENCOUNTER — Telehealth: Payer: Self-pay | Admitting: Cardiovascular Disease

## 2011-04-26 NOTE — Telephone Encounter (Signed)
Pt returning your call

## 2011-04-26 NOTE — Telephone Encounter (Signed)
Spoke with pt, aware of carotid results Kenneth Jennings  

## 2011-05-23 ENCOUNTER — Ambulatory Visit (INDEPENDENT_AMBULATORY_CARE_PROVIDER_SITE_OTHER): Payer: Medicare PPO | Admitting: Cardiovascular Disease

## 2011-05-23 ENCOUNTER — Encounter: Payer: Self-pay | Admitting: Cardiovascular Disease

## 2011-05-23 DIAGNOSIS — E7800 Pure hypercholesterolemia, unspecified: Secondary | ICD-10-CM

## 2011-05-23 DIAGNOSIS — E119 Type 2 diabetes mellitus without complications: Secondary | ICD-10-CM

## 2011-05-23 DIAGNOSIS — I4891 Unspecified atrial fibrillation: Secondary | ICD-10-CM

## 2011-05-23 DIAGNOSIS — I6529 Occlusion and stenosis of unspecified carotid artery: Secondary | ICD-10-CM

## 2011-05-23 DIAGNOSIS — I10 Essential (primary) hypertension: Secondary | ICD-10-CM

## 2011-05-23 NOTE — Assessment & Plan Note (Signed)
F/U duplex in 6 months  No recurrent TIA;s  Continue ASA and coumadin

## 2011-05-23 NOTE — Patient Instructions (Addendum)
Your physician recommends that you schedule a follow-up appointment in: 6  Months.  Amiodarone is at lowest dose currently.  Please call Dr. Eden Emms is you reconsider pacemaker placement.  Continue taking losartan 50mg  by mouth twice daily.

## 2011-05-23 NOTE — Assessment & Plan Note (Signed)
Discussed low carb diet.  Target hemoglobin A1c is 6.5 or less.  Continue current medications.  

## 2011-05-23 NOTE — Assessment & Plan Note (Signed)
Well controlled.  Continue current medications and low sodium Dash type diet.    

## 2011-05-23 NOTE — Progress Notes (Signed)
Patient ID: Kenneth Jennings, male   DOB: [birth year], 70 y.o.   MRN: [id number]

## 2011-05-23 NOTE — Assessment & Plan Note (Signed)
Maint NSR on low dose amiodarone.  SSS will eventually need pacer.  Avoid beta blockers.  Continue coumadin

## 2011-05-23 NOTE — Assessment & Plan Note (Signed)
Cholesterol is at goal.  Continue current dose of statin and diet Rx.  No myalgias or side effects.  F/U  LFT's in 6 months. Lab Results  Component Value Date   LDLCALC  Value: 94        Total Cholesterol/HDL:CHD Risk Coronary Heart Disease Risk Table                     Men   Women  1/2 Average Risk   3.4   3.3  Average Risk       5.0   4.4  2 X Average Risk   9.6   7.1  3 X Average Risk  23.4   11.0        Use the calculated Patient Ratio above and the CHD Risk Table to determine the patient's CHD Risk.        ATP III CLASSIFICATION (LDL):  <100     mg/dL   Optimal  161-096  mg/dL   Near or Above                    Optimal  130-159  mg/dL   Borderline  045-409  mg/dL   High  >811     mg/dL   Very High 91/47/8295

## 2011-05-23 NOTE — Progress Notes (Addendum)
Patient ID: Kenneth Jennings, male   DOB: 12/10/1940, 70 y.o.   MRN: [id number] Kenneth Jennings is a 70 yo male with AFib/Flutter, HTN, DM2, HLP and carotid stenosis. He had a left VATS for chronic left pleural effusion in Dec 2011. He has significant LAE and is not a candidate for AFib ablation. He is on coumadin. He has not been able to take beta blockers or CCB 2/2 to bradycardia. Dr. Johney Frame had seen him recently and he is not interested in a pacemaker at this time. He called in recently with concerns about his BP. He was apparently put on amlodipine. He had his losartan increased, but after calling in with further concerns, this was changed to Benicar on 08/09/10. He returns today to follow up on his BP.  He seems to think that the Benicar is making his blood pressure go up. Reviewed two recent ER visits for elevated BP. Agree with stopping Benicar He denies chest pain, shortness of breath, syncope. He does have some headaches, although he feels that this could be from sinus congestion. He feels flushed after taking the Benicar. He denies any rash or difficulty with breathing. He Is still taking his Lasix  Also has had left VATS with [facility]. CXR With ? Lung nodule but F/U CT in [place] no lesion. 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 Will do DLCO in 3 months.  Has insomnia ? Made worse by amiodarone  His PT has been fine on lower dose amiodarone. Will check labs and PFT;s in 6 months  Had a nice trip to [place] with family but had way too much pizza  Amiodarone only at 100mg    Will eventually need pacer I think  May make BP easier to control  He indicates being on losartin now for BP Also think that pacing at rate of 60 Would make recurrence of afib less likely.  He is a bowler and may consider pacer in Spring   ROS: Denies fever, malais, weight loss, blurry vision, decreased visual acuity, cough, sputum, SOB, hemoptysis, pleuritic pain, palpitaitons, heartburn, abdominal pain, melena,  lower extremity edema, claudication, or rash.  All other systems reviewed and negative  General: Affect appropriate Healthy:  appears stated age HEENT: normal Neck supple with no adenopathy JVP normal no bruits no thyromegaly Lungs clear with no wheezing and good diaphragmatic motion Heart:  S1/S2 no murmur,rub, gallop or click PMI normal Abdomen: benighn, BS positve, no tenderness, no AAA no bruit.  No HSM or HJR Distal pulses intact with no bruits No edema Neuro non-focal Skin warm and dry No muscular weakness   Current Outpatient Prescriptions  Medication Sig Dispense Refill  . amiodarone (PACERONE) 100 MG tablet Take 1 tablet (100 mg total) by mouth daily.  30 tablet  12  . Ascorbic Acid (VITAMIN C) 500 MG tablet Take 500 mg by mouth daily.       . B Complex Vitamins (VITAMIN B COMPLEX PO) 1 tab po qd       . Cholecalciferol (VITAMIN D3) 1000 UNITS CAPS 1 or 2 tabs po qd      . CINNAMON PO 1 tab po bid       . Coenzyme Q10 (CO Q 10 PO) Take 1 tablet by mouth daily.        . furosemide (LASIX) 40 MG tablet Take 40 mg by mouth daily.        . insulin glargine (LANTUS SOLOSTAR) 100 UNIT/ML injection Inject 20 Units into the  skin at bedtime.       Marland Kitchen KRILL OIL PO 1 tab po qd       . MAGNESIUM CARBONATE PO Take 1 tablet by mouth daily.        Marland Kitchen PRESCRIPTION MEDICATION Place 1 drop into both eyes at bedtime. Eye drop for glaucoma       . TIMOLOL MALEATE PO Place 1 drop into the right eye 2 (two) times daily.        Marland Kitchen warfarin (COUMADIN) 10 MG tablet Take 10 mg by mouth 3 (three) times a week. As diretced      . warfarin (COUMADIN) 4 MG tablet Take 8 mg by mouth 4 (four) times a week.        . zolpidem (AMBIEN) 10 MG tablet Take 5 mg by mouth at bedtime as needed. For sleep         Allergies  Benicar and Amlodipine  Electrocardiogram:  Sinus bradycardia rate 46 otherwise normal  Assessment and Plan

## 2011-05-29 ENCOUNTER — Telehealth: Payer: Self-pay | Admitting: Cardiovascular Disease

## 2011-05-29 NOTE — Telephone Encounter (Signed)
SPOKE WITH PT RE MESSAGE,PER PT  HAD A LOT OF SALT YESTERDAY  THINKS MAY HAVE TRIGGERED AFIB HEART LAST NIGHT  WAS 114 TODAY IS STILL BEATING   AT 76 WHICH HE SAYS IS FAST FOR HIM  HE NORMALLY IS AROUND 46  B/P IS BETTER THIS AM  PT IS WANTING TO PROCEED  WITH PACER IMPLANT  APPT  MADE  WITH DR Johney Frame FOR 06-07-11 AT 2:45 PM. PT WOULD LIKE FOR YOU TO GIVE HIM A CALL PT AWARE WILL FORWARD TO DR Eden Emms FOR HIS REVIEW .Zack Seal

## 2011-05-29 NOTE — Telephone Encounter (Signed)
New msg Pt said his was BP 223/126 last night, this morning 135/75 and is in afib. Please call him back

## 2011-05-29 NOTE — Telephone Encounter (Signed)
Pt is calling back about afib He wants to talk to you again

## 2011-06-07 ENCOUNTER — Ambulatory Visit (INDEPENDENT_AMBULATORY_CARE_PROVIDER_SITE_OTHER): Payer: Medicare PPO | Admitting: Internal Medicine

## 2011-06-07 ENCOUNTER — Encounter: Payer: Self-pay | Admitting: Internal Medicine

## 2011-06-07 VITALS — BP 159/82 | HR 80 | Ht 72.0 in | Wt 253.0 lb

## 2011-06-07 DIAGNOSIS — I4891 Unspecified atrial fibrillation: Secondary | ICD-10-CM

## 2011-06-07 DIAGNOSIS — I495 Sick sinus syndrome: Secondary | ICD-10-CM

## 2011-06-07 NOTE — Assessment & Plan Note (Signed)
Given atrial enlargement, I think that rhythm control would be difficult long term. Continue amiodarone for rate control.  If decides to have PPM implanted, then I would increase amiodarone to 200mg  daily and proceed with cardioversion at the time of implantation.  Otherwise, I would consider rate control long term. Continue coumadin long term.

## 2011-06-07 NOTE — Progress Notes (Signed)
PCP:  Nehemiah Massed., MD Primary Cardiologist:  Dr Eden Emms  The patient presents today for electrophysiology followup.  Since last being seen by me, the patient reports doing reasonably well.  He has recently returned to afib.  He reports having tachy palpitations 2 weeks ago at which time his heart rate was 110s.  Since that time, he feels that his heart rates have been better controlled.  He also reports chronic bradycardia for which medical therapy for his afib has been limited.  He has occasional dizziness which he describes as abrupt in onset, lasting only several seconds.  He denies syncope.  Today, he denies symptoms of chest pain, shortness of breath, orthopnea, PND, lower extremity edema, or neurologic sequela.  The patient feels that he is tolerating medications without difficulties and is otherwise without complaint today.   Past Medical History  Diagnosis Date  . HTN (hypertension)   . Atrial fibrillation     persistent  . Cerebrovascular accident     10 years ago  . Obesity   . Diabetes mellitus   . Somnolence   . Hypercholesteremia   . Insomnia   . Pleural effusion   . Sleep apnea   . Deviated nasal septum   . Atrial enlargement, left   . Tachycardia-bradycardia    Past Surgical History  Procedure Date  . 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 Lung procedure     Current Outpatient Prescriptions  Medication Sig Dispense Refill  . amiodarone (PACERONE) 100 MG tablet Take 200 mg by mouth daily.        . Ascorbic Acid (VITAMIN C) 500 MG tablet Take 500 mg by mouth daily.       . Astaxanthin 4 MG CAPS 6 mg 1 tab po qd       . B Complex Vitamins (VITAMIN B COMPLEX PO) 1 tab po qd       . Cholecalciferol (VITAMIN D3) 1000 UNITS CAPS 1 or 2 tabs po qd      . CINNAMON PO 1 tab po qd      . Coenzyme Q10 (CO Q 10 PO) Take 1 tablet by mouth daily.        . furosemide (LASIX) 40 MG tablet Take 40 mg by mouth daily.        . insulin glargine (LANTUS SOLOSTAR) 100  UNIT/ML injection Inject 20 Units into the skin at bedtime.       Marland Kitchen KRILL OIL PO 1 tab po qd       . losartan (COZAAR) 50 MG tablet Take 50 mg by mouth 2 (two) times daily.        Marland Kitchen MAGNESIUM CARBONATE PO Take 1 tablet by mouth daily.        Marland Kitchen PRESCRIPTION MEDICATION Place 1 drop into both eyes at bedtime. Eye drop for glaucoma       . TIMOLOL MALEATE PO Place 1 drop into the right eye 2 (two) times daily.        Marland Kitchen warfarin (COUMADIN) 10 MG tablet Take 10 mg by mouth 3 (three) times a week. As diretced      . warfarin (COUMADIN) 4 MG tablet Take 8 mg by mouth 4 (four) times a week.        . zolpidem (AMBIEN) 10 MG tablet Take 5 mg by mouth at bedtime as needed. For sleep         Allergies  Allergen Reactions  . Benicar (Olmesartan Medoxomil)  Flushing and Headache   . Amlodipine Anxiety    History   Social History  . Marital Status: Married    Spouse Name: N/A    Number of Children: N/A  . Years of Education: N/A   Occupational History  . Not on file.   Social History Main Topics  . Smoking status: Former Smoker    Quit date: 06/12/1999  . Smokeless tobacco: Never Used  . Alcohol Use: Yes     rare  . Drug Use: No  . Sexually Active: Not on file   Other Topics Concern  . Not on file   Social History Narrative   RetiredRaising his 2 grandchildren as his son is an Immunologist and his wife commited suicideLikes to golf and watch [place] sports    Family History  Problem Relation Age of Onset  . Coronary artery disease      ROS-  All systems are reviewed and are negative except as outlined in the HPI above  Physical Exam: Filed Vitals:   06/07/11 1444  BP: 159/82  Pulse: 80  Height: 6' (1.829 m)  Weight: 253 lb (114.76 kg)    GEN- The patient is well appearing, alert and oriented x 3 today.   Head- normocephalic, atraumatic Eyes-  Sclera clear, conjunctiva pink Ears- hearing intact Oropharynx- clear Neck- supple, no JVP Lymph- no cervical  lymphadenopathy Lungs- Clear to ausculation bilaterally, normal work of breathing Heart- irregular rate and rhythm, no murmurs, rubs or gallops, PMI not laterally displaced GI- soft, NT, ND, + BS Extremities- no clubbing, cyanosis, or edema  ekg today reveals afib, V rate 74 bpm, otherwise normal ekg  Assessment and Plan:

## 2011-06-07 NOTE — Assessment & Plan Note (Signed)
The patient has pretty clear tachy/brady syndrome.  His heart rate in sinus rhythm is 40s.  He has occasional RVR during afib, for which therapies are limited.  I agree with Dr Eden Emms that PPM implantation is reasonable. Risks, benefits, alternatives to pacemaker implantation were discussed in detail with the patient today.  At this time, he is not ready to proceed with PPM implantation.  He would like to further contemplate this option.  He will contact my office if he decides to proceed with PPM implantation, otherwise, I will see him again in 2 months.

## 2011-06-07 NOTE — Patient Instructions (Signed)
Your physician recommends that you schedule a follow-up appointment in: 2 months with Dr Johney Frame  If you decide to have Pacemaker before call us at [phone]  And ask for [facility]

## 2011-06-08 ENCOUNTER — Telehealth: Payer: Self-pay | Admitting: Internal Medicine

## 2011-06-08 DIAGNOSIS — I495 Sick sinus syndrome: Secondary | ICD-10-CM

## 2011-06-08 NOTE — Telephone Encounter (Signed)
New Problem:    Patient has decided to get a pacemaker and would like for you to give him a call back.

## 2011-06-08 NOTE — Telephone Encounter (Signed)
Called and spoke with patient   Will schedule for 06/22/11 with Dr Johney Frame for pacer implant

## 2011-06-08 NOTE — Progress Notes (Signed)
Addended by: Micki Riley C on: 06/08/2011 01:54 PM   Modules accepted: Orders

## 2011-06-11 ENCOUNTER — Encounter: Payer: Self-pay | Admitting: *Deleted

## 2011-06-11 ENCOUNTER — Encounter (HOSPITAL_COMMUNITY): Payer: Self-pay | Admitting: Pharmacy Technician

## 2011-06-11 ENCOUNTER — Other Ambulatory Visit: Payer: Self-pay | Admitting: *Deleted

## 2011-06-11 DIAGNOSIS — I495 Sick sinus syndrome: Secondary | ICD-10-CM

## 2011-06-13 ENCOUNTER — Telehealth: Payer: Self-pay | Admitting: Internal Medicine

## 2011-06-13 NOTE — Telephone Encounter (Signed)
Spoke with patient  He is going to pick up his instruction sheet on 06/15/11 when he has his labs drawn  Patient aware he needs to get this at the time of labs

## 2011-06-13 NOTE — Telephone Encounter (Signed)
New Problem:    Patient called wanting to speak with you about his pacemaker.

## 2011-06-13 NOTE — Telephone Encounter (Signed)
Called patient back and spoke with him. 

## 2011-06-15 ENCOUNTER — Other Ambulatory Visit (INDEPENDENT_AMBULATORY_CARE_PROVIDER_SITE_OTHER): Payer: Medicare PPO | Admitting: *Deleted

## 2011-06-15 DIAGNOSIS — I495 Sick sinus syndrome: Secondary | ICD-10-CM

## 2011-06-15 LAB — BASIC METABOLIC PANEL
Chloride: 95 mEq/L — ABNORMAL LOW (ref 96–112)
Potassium: 3.9 mEq/L (ref 3.5–5.1)
Sodium: 133 mEq/L — ABNORMAL LOW (ref 135–145)

## 2011-06-15 LAB — CBC WITH DIFFERENTIAL/PLATELET
Basophils Absolute: 0.1 10*3/uL (ref 0.0–0.1)
Eosinophils Absolute: 0.1 10*3/uL (ref 0.0–0.7)
Lymphocytes Relative: 18.1 % (ref 12.0–46.0)
MCHC: 34.5 g/dL (ref 30.0–36.0)
Neutrophils Relative %: 66.7 % (ref 43.0–77.0)
RDW: 13.2 % (ref 11.5–14.6)

## 2011-06-15 LAB — PROTIME-INR
INR: 2.8 ratio — ABNORMAL HIGH (ref 0.8–1.0)
Prothrombin Time: 30.6 s — ABNORMAL HIGH (ref 10.2–12.4)

## 2011-06-17 IMAGING — CR DG CHEST 2V
2 series · 2 of 2 positions shown · non-contrast
Comparison: 04/25/2010 radiographs and 04/20/2010 CT.

CLINICAL DATA: Shortness of breath.  Follow-up left pleural
effusion.

CHEST - 2 VIEW

[view not recorded (1 of 2)]
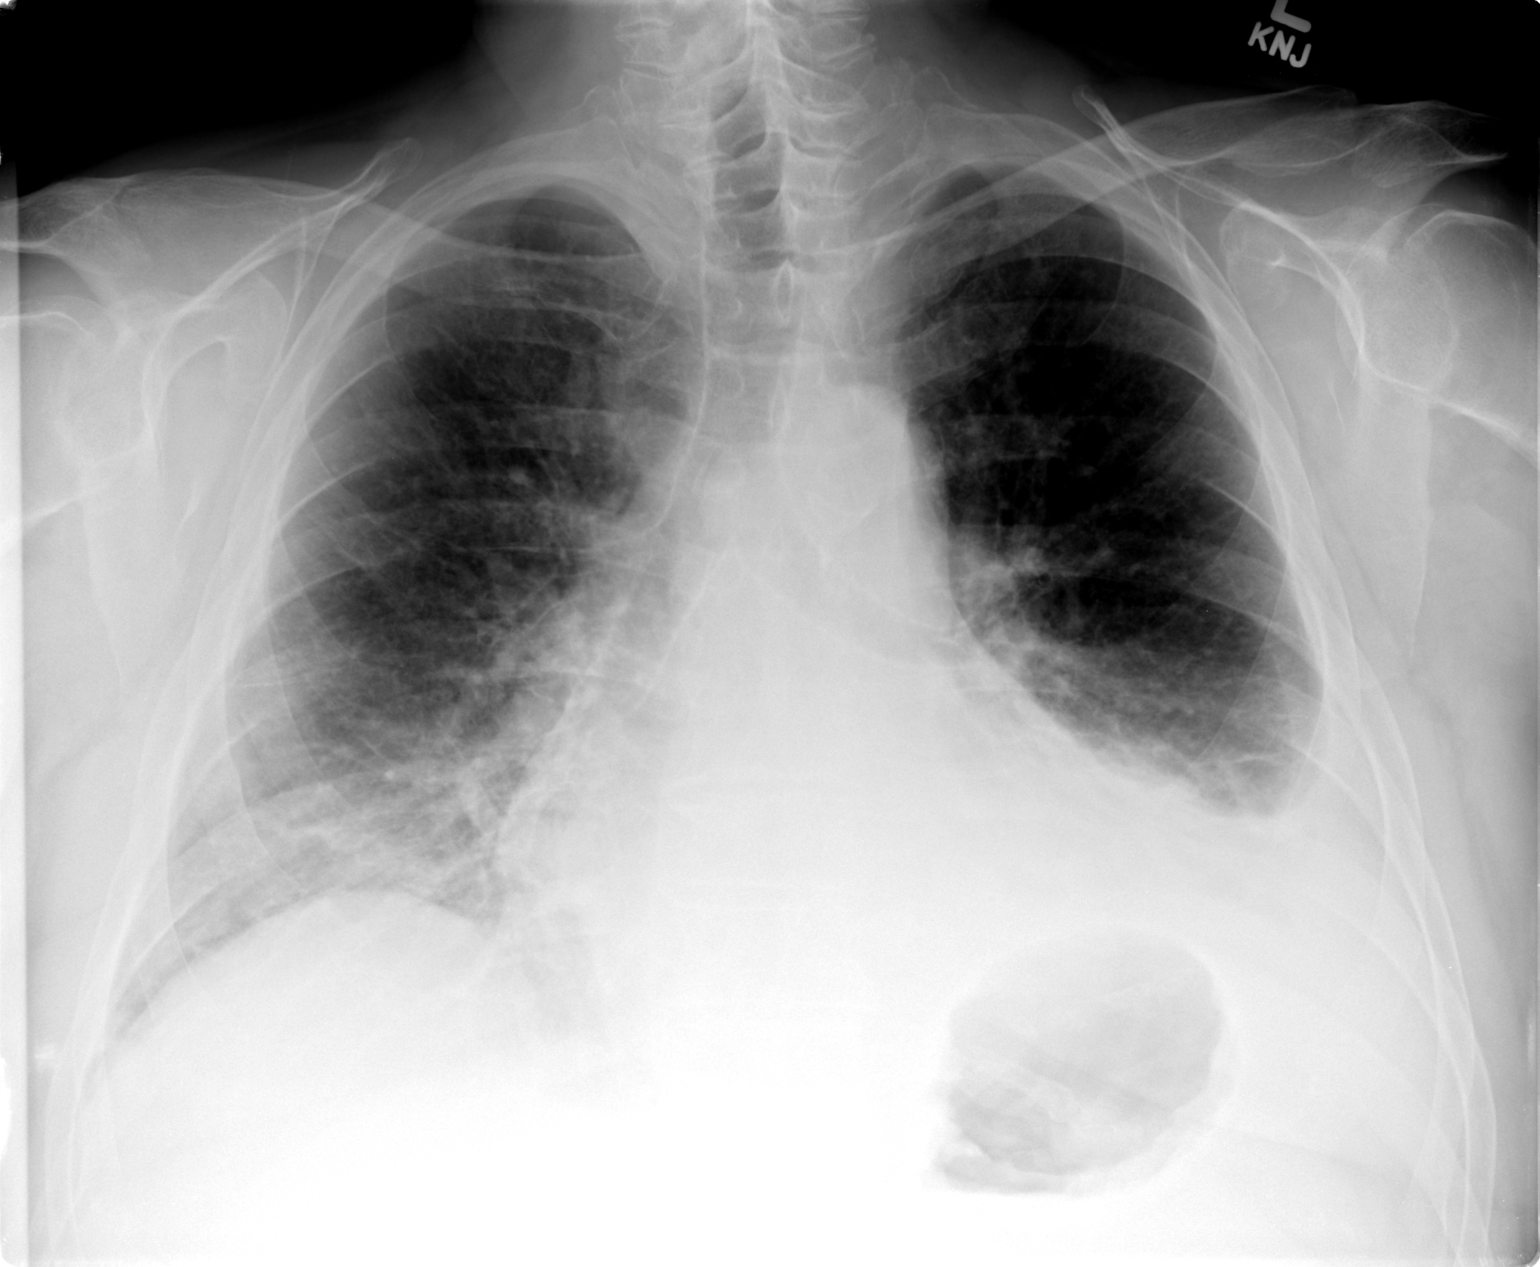

[view not recorded (2 of 2)]
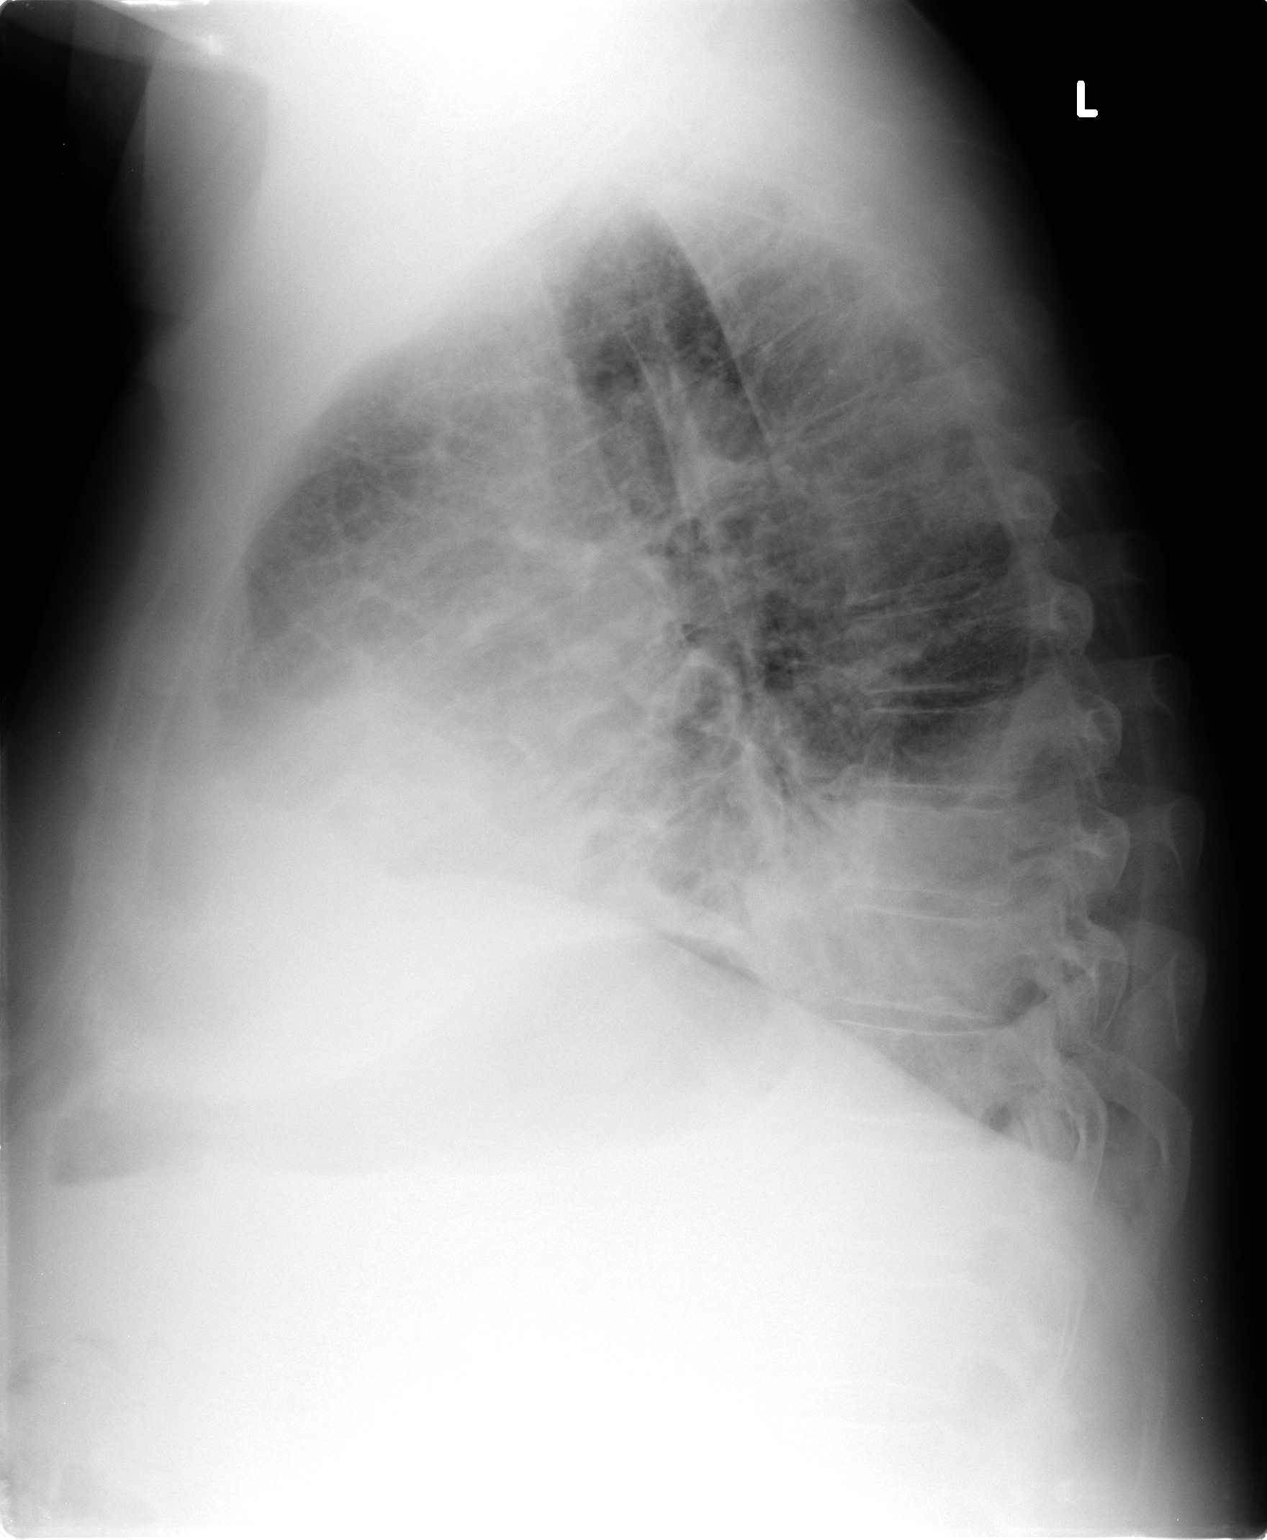

[2 of 2 positions shown; findings below may reference images not displayed]

FINDINGS: Left pleural effusion has not significantly changed.
There is associated left lower lobe atelectasis.  The pulmonary
vasculature appears slightly less well defined, suggesting
superimposed edema.  The heart size and mediastinal contours are
stable.
IMPRESSION: 1.  Stable left pleural effusion and associated left basilar air
space disease.
2.  Question mild pulmonary edema.

## 2011-06-18 ENCOUNTER — Telehealth: Payer: Self-pay | Admitting: Internal Medicine

## 2011-06-18 NOTE — Telephone Encounter (Signed)
Pt wants to check on lab work for procedure next week and to find out what meds he needs to take or not take

## 2011-06-18 NOTE — Telephone Encounter (Signed)
Per Dr Johney Frame hold Coumadin the pm before procedure  Patient aware

## 2011-06-18 NOTE — Telephone Encounter (Signed)
Pt having a pacer put in on friday, does he take any meds Friday am

## 2011-06-18 NOTE — Telephone Encounter (Signed)
Spoke with patient again  He verbalized understanding of what medications to take

## 2011-06-21 MED ORDER — SODIUM CHLORIDE 0.45 % IV SOLN: INTRAVENOUS | Status: DC

## 2011-06-21 MED ORDER — CEFAZOLIN SODIUM-DEXTROSE 2-3 GM-% IV SOLR
2.0000 g | INTRAVENOUS | Status: AC
  Administered 2011-06-22: 2 g via INTRAVENOUS
  Filled 2011-06-21: qty 50

## 2011-06-21 MED ORDER — SODIUM CHLORIDE 0.9 % IV SOLN
INTRAVENOUS | Status: DC
  Administered 2011-06-22: 09:00:00 via INTRAVENOUS

## 2011-06-21 MED ORDER — SODIUM CHLORIDE 0.9 % IR SOLN
80.0000 mg | Status: DC
  Filled 2011-06-21: qty 2

## 2011-06-22 ENCOUNTER — Ambulatory Visit (HOSPITAL_COMMUNITY)
Admission: RE | Admit: 2011-06-22 | Discharge: 2011-06-23 | Disposition: A | Discharge status: Home / Self Care | Payer: Medicare PPO | Source: Ambulatory Visit | Attending: Internal Medicine | Admitting: Internal Medicine

## 2011-06-22 ENCOUNTER — Encounter (HOSPITAL_COMMUNITY)
Admission: RE | Disposition: A | Discharge status: Home / Self Care | Payer: Self-pay | Source: Ambulatory Visit | Attending: Internal Medicine

## 2011-06-22 DIAGNOSIS — I495 Sick sinus syndrome: Secondary | ICD-10-CM

## 2011-06-22 DIAGNOSIS — G4730 Sleep apnea, unspecified: Secondary | ICD-10-CM | POA: Insufficient documentation

## 2011-06-22 DIAGNOSIS — I4891 Unspecified atrial fibrillation: Secondary | ICD-10-CM

## 2011-06-22 DIAGNOSIS — I10 Essential (primary) hypertension: Secondary | ICD-10-CM | POA: Insufficient documentation

## 2011-06-22 DIAGNOSIS — E7800 Pure hypercholesterolemia, unspecified: Secondary | ICD-10-CM | POA: Insufficient documentation

## 2011-06-22 DIAGNOSIS — E669 Obesity, unspecified: Secondary | ICD-10-CM | POA: Insufficient documentation

## 2011-06-22 DIAGNOSIS — Z8673 Personal history of transient ischemic attack (TIA), and cerebral infarction without residual deficits: Secondary | ICD-10-CM | POA: Insufficient documentation

## 2011-06-22 DIAGNOSIS — E119 Type 2 diabetes mellitus without complications: Secondary | ICD-10-CM | POA: Insufficient documentation

## 2011-06-22 HISTORY — PX: PERMANENT PACEMAKER INSERTION: SHX5480

## 2011-06-22 HISTORY — PX: PACEMAKER INSERTION: SHX728

## 2011-06-22 LAB — GLUCOSE, CAPILLARY
Glucose-Capillary: 126 mg/dL — ABNORMAL HIGH (ref 70–99)
Glucose-Capillary: 125 mg/dL — ABNORMAL HIGH (ref 70–99)
Glucose-Capillary: 186 mg/dL — ABNORMAL HIGH (ref 70–99)

## 2011-06-22 LAB — SURGICAL PCR SCREEN: MRSA, PCR: NEGATIVE

## 2011-06-22 SURGERY — PERMANENT PACEMAKER INSERTION
Anesthesia: LOCAL | Laterality: Left

## 2011-06-22 MED ORDER — CHLORHEXIDINE GLUCONATE 4 % EX LIQD
60.0000 mL | Freq: Once | CUTANEOUS | Status: DC
  Filled 2011-06-22: qty 60

## 2011-06-22 MED ORDER — INSULIN GLARGINE 100 UNIT/ML ~~LOC~~ SOLN
20.0000 [IU] | Freq: Every day | SUBCUTANEOUS | Status: DC
  Administered 2011-06-22: 20 [IU] via SUBCUTANEOUS
  Filled 2011-06-22: qty 3

## 2011-06-22 MED ORDER — MUPIROCIN 2 % EX OINT
TOPICAL_OINTMENT | CUTANEOUS | Status: AC
Start: 2011-06-22 — End: 2011-06-22
  Filled 2011-06-22: qty 22

## 2011-06-22 MED ORDER — LOSARTAN POTASSIUM 50 MG PO TABS
50.0000 mg | ORAL_TABLET | Freq: Two times a day (BID) | ORAL | Status: DC
  Administered 2011-06-22 – 2011-06-23 (×2): 50 mg via ORAL
  Filled 2011-06-22 (×3): qty 1

## 2011-06-22 MED ORDER — ACETAMINOPHEN 325 MG PO TABS
ORAL_TABLET | ORAL | Status: AC
  Filled 2011-06-22: qty 1

## 2011-06-22 MED ORDER — MIDAZOLAM HCL 5 MG/5ML IJ SOLN
INTRAMUSCULAR | Status: AC
  Filled 2011-06-22: qty 5

## 2011-06-22 MED ORDER — ACETAMINOPHEN 500 MG PO TABS
1000.0000 mg | ORAL_TABLET | Freq: Four times a day (QID) | ORAL | Status: DC
  Administered 2011-06-22 – 2011-06-23 (×3): 1000 mg via ORAL
  Filled 2011-06-22 (×3): qty 2

## 2011-06-22 MED ORDER — INSULIN ASPART 100 UNIT/ML ~~LOC~~ SOLN
0.0000 [IU] | Freq: Three times a day (TID) | SUBCUTANEOUS | Status: DC
  Filled 2011-06-22: qty 3

## 2011-06-22 MED ORDER — AMIODARONE HCL 200 MG PO TABS
200.0000 mg | ORAL_TABLET | Freq: Every day | ORAL | Status: DC
  Administered 2011-06-23: 200 mg via ORAL
  Filled 2011-06-22: qty 1

## 2011-06-22 MED ORDER — ZOLPIDEM TARTRATE 5 MG PO TABS
10.0000 mg | ORAL_TABLET | Freq: Every evening | ORAL | Status: DC | PRN
  Administered 2011-06-22: 10 mg via ORAL
  Filled 2011-06-22: qty 2

## 2011-06-22 MED ORDER — HEPARIN (PORCINE) IN NACL 2-0.9 UNIT/ML-% IJ SOLN
INTRAMUSCULAR | Status: AC
  Filled 2011-06-22: qty 1000

## 2011-06-22 MED ORDER — MUPIROCIN 2 % EX OINT
TOPICAL_OINTMENT | Freq: Once | CUTANEOUS | Status: DC
  Filled 2011-06-22: qty 22

## 2011-06-22 MED ORDER — WARFARIN SODIUM 4 MG PO TABS
4.0000 mg | ORAL_TABLET | Freq: Once | ORAL | Status: AC
  Administered 2011-06-22: 4 mg via ORAL
  Filled 2011-06-22 (×2): qty 1

## 2011-06-22 MED ORDER — SODIUM CHLORIDE 0.9 % IJ SOLN
3.0000 mL | Freq: Two times a day (BID) | INTRAMUSCULAR | Status: DC
  Administered 2011-06-22: 3 mL via INTRAVENOUS

## 2011-06-22 MED ORDER — TIMOLOL MALEATE 5 MG PO TABS: 5.0000 mg | ORAL_TABLET | Freq: Every day | ORAL | Status: DC

## 2011-06-22 MED ORDER — LATANOPROST 0.005 % OP SOLN
1.0000 [drp] | Freq: Every day | OPHTHALMIC | Status: DC
Start: 2011-06-22 — End: 2011-06-23
  Administered 2011-06-22: 1 [drp] via OPHTHALMIC
  Filled 2011-06-22: qty 2.5

## 2011-06-22 MED ORDER — SODIUM CHLORIDE 0.9 % IV SOLN
250.0000 mL | INTRAVENOUS | Status: DC | PRN
Start: 2011-06-22 — End: 2011-06-23

## 2011-06-22 MED ORDER — CEFAZOLIN SODIUM 1-5 GM-% IV SOLN
1.0000 g | Freq: Four times a day (QID) | INTRAVENOUS | Status: AC
  Administered 2011-06-22 (×3): 1 g via INTRAVENOUS
  Filled 2011-06-22 (×2): qty 50

## 2011-06-22 MED ORDER — CEFAZOLIN SODIUM-DEXTROSE 2-3 GM-% IV SOLR
INTRAVENOUS | Status: AC
  Filled 2011-06-22: qty 50

## 2011-06-22 MED ORDER — LIDOCAINE HCL (PF) 1 % IJ SOLN
INTRAMUSCULAR | Status: AC
  Filled 2011-06-22: qty 60

## 2011-06-22 MED ORDER — ACETAMINOPHEN 325 MG PO TABS: 325.0000 mg | ORAL_TABLET | ORAL | Status: DC | PRN

## 2011-06-22 MED ORDER — ONDANSETRON HCL 4 MG/2ML IJ SOLN: 4.0000 mg | Freq: Four times a day (QID) | INTRAMUSCULAR | Status: DC | PRN

## 2011-06-22 MED ORDER — HYDROCODONE-ACETAMINOPHEN 5-325 MG PO TABS
1.0000 | ORAL_TABLET | ORAL | Status: DC | PRN
  Administered 2011-06-22 – 2011-06-23 (×3): 2 via ORAL
  Filled 2011-06-22 (×3): qty 2

## 2011-06-22 MED ORDER — SODIUM CHLORIDE 0.9 % IJ SOLN: 3.0000 mL | INTRAMUSCULAR | Status: DC | PRN

## 2011-06-22 MED ORDER — FENTANYL CITRATE 0.05 MG/ML IJ SOLN
INTRAMUSCULAR | Status: AC
  Filled 2011-06-22: qty 2

## 2011-06-22 MED ORDER — TIMOLOL MALEATE 0.5 % OP SOLN
1.0000 [drp] | Freq: Two times a day (BID) | OPHTHALMIC | Status: DC
  Administered 2011-06-22 – 2011-06-23 (×2): 1 [drp] via OPHTHALMIC
  Filled 2011-06-22: qty 5

## 2011-06-22 NOTE — Progress Notes (Signed)
ANTICOAGULATION CONSULT NOTE - Initial Consult  Pharmacy Consult for: Coumadin Indication: atrial fibrillation  Allergies  Allergen Reactions  . Benicar (Olmesartan Medoxomil)     Flushing and Headache   . Amlodipine Anxiety    Patient Measurements: Height: 6' 0.25" (183.5 cm) Weight: 241 lb (109.317 kg) IBW/kg (Calculated) : 78.18    Vital Signs: Temp: 98.2 F (36.8 C) (01/11 1754) Temp src: Oral (01/11 1754) BP: 141/72 mmHg (01/11 1754) Pulse Rate: 60  (01/11 1754)  Labs:  Basename 06/22/11 0849  HGB --  HCT --  PLT --  APTT --  LABPROT 30.6*  INR 2.88*  HEPARINUNFRC --  CREATININE --  CKTOTAL --  CKMB --  TROPONINI --   Estimated Creatinine Clearance: 53.5 ml/min (by C-G formula based on Cr of 1.6).  Medical History: Past Medical History  Diagnosis Date  . HTN (hypertension)   . Atrial fibrillation     persistent  . Cerebrovascular accident     10 years ago  . Obesity   . Diabetes mellitus   . Somnolence   . Hypercholesteremia   . Insomnia   . Pleural effusion   . Sleep apnea   . Deviated nasal septum   . Atrial enlargement, left   . Tachycardia-bradycardia     Medications:  Prescriptions prior to admission  Medication Sig Dispense Refill  . amiodarone (PACERONE) 100 MG tablet Take 200 mg by mouth daily.        . Ascorbic Acid (VITAMIN C) 500 MG tablet Take 500 mg by mouth daily.       . Astaxanthin 4 MG CAPS Take 6 mg by mouth daily.       . B Complex Vitamins (VITAMIN B COMPLEX PO) Take 1 tablet by mouth daily.       . Cholecalciferol (VITAMIN D3) 1000 UNITS CAPS Take 1 capsule by mouth daily.       Marland Kitchen CINNAMON PO Take 1 tablet by mouth daily.       . Coenzyme Q10 (CO Q 10 PO) Take 1 tablet by mouth daily.        . furosemide (LASIX) 40 MG tablet Take 40 mg by mouth daily.        . insulin glargine (LANTUS SOLOSTAR) 100 UNIT/ML injection Inject 20 Units into the skin at bedtime.       Marland Kitchen KRILL OIL PO Take 1 tablet by mouth daily.       Marland Kitchen  latanoprost (XALATAN) 0.005 % ophthalmic solution Place 1 drop into both eyes at bedtime.        Marland Kitchen losartan (COZAAR) 50 MG tablet Take 50 mg by mouth 2 (two) times daily.        Marland Kitchen MAGNESIUM CARBONATE PO Take 2 tablets by mouth daily.       Marland Kitchen TIMOLOL MALEATE PO Place 1 drop into the right eye 2 (two) times daily.       Marland Kitchen warfarin (COUMADIN) 10 MG tablet Take 10 mg by mouth 3 (three) times a week. As diretced      . warfarin (COUMADIN) 4 MG tablet Take 8 mg by mouth 4 (four) times a week.        . zolpidem (AMBIEN) 10 MG tablet Take 10 mg by mouth at bedtime as needed. For sleep       Assessment: 70yom on coumadin pta for afib, now to resume s/p PPM. Per patient, home dose is 8mg  daily except 10mg  on Sun/Tues/Thurs. Last dose was 1/9. INR is  therapeutic today at 2.88. Per MD request, will give half of usual dose tonight.  Goal of Therapy:  INR 2-3   Plan:  1) Coumadin 4mg  x 1 2) Daily INR  Fredrik Rigger 06/22/2011,6:22 PM

## 2011-06-22 NOTE — Brief Op Note (Signed)
06/22/2011  12:13 PM  PATIENT:  Kenneth Jennings  71 y.o. male  PRE-OPERATIVE DIAGNOSIS:  Tachy Brady  POST-OPERATIVE DIAGNOSIS:  * No post-op diagnosis entered *  PROCEDURE:  Procedure(s): PERMANENT PACEMAKER INSERTION  SURGEON:  Surgeon(s): Gardiner Rhyme, MD  PHYSICIAN ASSISTANT:   ASSISTANTS: none   ANESTHESIA:   IV sedation  EBL:     BLOOD ADMINISTERED:none  DRAINS: none   LOCAL MEDICATIONS USED:  LIDOCAINE 9CC  SPECIMEN:  No Specimen  DISPOSITION OF SPECIMEN:  N/A  COUNTS:  YES  TOURNIQUET:  * No tourniquets in log *  DICTATION: .Note written in EPIC  PLAN OF CARE: Admit for overnight observation  PATIENT DISPOSITION:  PACU - hemodynamically stable.   Delay start of Pharmacological VTE agent (>24hrs) due to surgical blood loss or risk of bleeding:  {YES/NO/NOT APPLICABLE:20182

## 2011-06-22 NOTE — H&P (View-Only) (Signed)
PCP:  GEHRIS,JOHN M., MD Primary Cardiologist:  Dr Nishan  The patient presents today for electrophysiology followup.  Since last being seen by me, the patient reports doing reasonably well.  He has recently returned to afib.  He reports having tachy palpitations 2 weeks ago at which time his heart rate was 110s.  Since that time, he feels that his heart rates have been better controlled.  He also reports chronic bradycardia for which medical therapy for his afib has been limited.  He has occasional dizziness which he describes as abrupt in onset, lasting only several seconds.  He denies syncope.  Today, he denies symptoms of chest pain, shortness of breath, orthopnea, PND, lower extremity edema, or neurologic sequela.  The patient feels that he is tolerating medications without difficulties and is otherwise without complaint today.   Past Medical History  Diagnosis Date  . HTN (hypertension)   . Atrial fibrillation     persistent  . Cerebrovascular accident     10 years ago  . Obesity   . Diabetes mellitus   . Somnolence   . Hypercholesteremia   . Insomnia   . Pleural effusion   . Sleep apnea   . Deviated nasal septum   . Atrial enlargement, left   . Tachycardia-bradycardia    Past Surgical History  Procedure Date  . 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 Lung procedure     Current Outpatient Prescriptions  Medication Sig Dispense Refill  . amiodarone (PACERONE) 100 MG tablet Take 200 mg by mouth daily.        . Ascorbic Acid (VITAMIN C) 500 MG tablet Take 500 mg by mouth daily.       . Astaxanthin 4 MG CAPS 6 mg 1 tab po qd       . B Complex Vitamins (VITAMIN B COMPLEX PO) 1 tab po qd       . Cholecalciferol (VITAMIN D3) 1000 UNITS CAPS 1 or 2 tabs po qd      . CINNAMON PO 1 tab po qd      . Coenzyme Q10 (CO Q 10 PO) Take 1 tablet by mouth daily.        . furosemide (LASIX) 40 MG tablet Take 40 mg by mouth daily.        . insulin glargine (LANTUS SOLOSTAR) 100  UNIT/ML injection Inject 20 Units into the skin at bedtime.       . KRILL OIL PO 1 tab po qd       . losartan (COZAAR) 50 MG tablet Take 50 mg by mouth 2 (two) times daily.        . MAGNESIUM CARBONATE PO Take 1 tablet by mouth daily.        . PRESCRIPTION MEDICATION Place 1 drop into both eyes at bedtime. Eye drop for glaucoma       . TIMOLOL MALEATE PO Place 1 drop into the right eye 2 (two) times daily.        . warfarin (COUMADIN) 10 MG tablet Take 10 mg by mouth 3 (three) times a week. As diretced      . warfarin (COUMADIN) 4 MG tablet Take 8 mg by mouth 4 (four) times a week.        . zolpidem (AMBIEN) 10 MG tablet Take 5 mg by mouth at bedtime as needed. For sleep         Allergies  Allergen Reactions  . Benicar (Olmesartan Medoxomil)       Flushing and Headache   . Amlodipine Anxiety    History   Social History  . Marital Status: Married    Spouse Name: N/A    Number of Children: N/A  . Years of Education: N/A   Occupational History  . Not on file.   Social History Main Topics  . Smoking status: Former Smoker    Quit date: 06/12/1999  . Smokeless tobacco: Never Used  . Alcohol Use: Yes     rare  . Drug Use: No  . Sexually Active: Not on file   Other Topics Concern  . Not on file   Social History Narrative   RetiredRaising his 2 grandchildren as his son is an alcoholoc and his wife commited suicideLikes to golf and watch [place] sports    Family History  Problem Relation Age of Onset  . Coronary artery disease      ROS-  All systems are reviewed and are negative except as outlined in the HPI above  Physical Exam: Filed Vitals:   06/07/11 1444  BP: 159/82  Pulse: 80  Height: 6' (1.829 m)  Weight: 253 lb (114.76 kg)    GEN- The patient is well appearing, alert and oriented x 3 today.   Head- normocephalic, atraumatic Eyes-  Sclera clear, conjunctiva pink Ears- hearing intact Oropharynx- clear Neck- supple, no JVP Lymph- no cervical  lymphadenopathy Lungs- Clear to ausculation bilaterally, normal work of breathing Heart- irregular rate and rhythm, no murmurs, rubs or gallops, PMI not laterally displaced GI- soft, NT, ND, + BS Extremities- no clubbing, cyanosis, or edema  ekg today reveals afib, V rate 74 bpm, otherwise normal ekg  Assessment and Plan:  

## 2011-06-22 NOTE — Op Note (Signed)
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 Left upper extremity venography.   2. Pacemaker implantation  3. Elective cardioversion     INTRODUCTION: Kenneth Jennings is a 71 y.o. male  with a history of persistent atrial fibrillation and tachycardia bradycardia syndrome who presents today for pacemaker implantation.      DESCRIPTION OF PROCEDURE:  Informed written consent was obtained, and the patient was brought to the electrophysiology lab in a fasting state.  The patient IV versed and fentanyl for IV sedation for the procedure today.  The patients left chest was prepped and draped in the usual sterile fashion by the EP lab staff. The skin overlying the left deltopectoral region was infiltrated with lidocaine for local analgesia.  A 4-cm incision was made over the left deltopectoral region.  A left subcutaneous pacemaker pocket was fashioned using a combination of sharp and blunt dissection. Electrocautery was required to assure hemostasis.    Left Upper Extremity Venography: A venogram of the left upper extremity was performed, which revealed a small left cephalic vein, which emptied into a large left subclavian vein.  The left axillary vein was large in size.    RA/RV Lead Placement: The left axillary vein was therefore cannulated.  Through the left axillary vein, a Medtronic model [id number] (serial number [id number]) right atrial lead and a Medtronic model 5092- 58 (serial number [id number]) right ventricular lead were advanced with fluoroscopic visualization into the right atrial appendage and right ventricular apex positions respectively. The patient presented to the electrophysiology today in atrial fibrillation.  Recent adequate anticoagulation with coumadin was confirmed.  The patient  was successfully cardioverted to sinus rhythm with a single 360J biphasic shock delivered.  He remained in sinus rhythm thereafter.  Initial atrial lead P- waves measured 6 mV with impedance of 660 ohms and a threshold of 1.0 V at 0.5 msec.  Right ventricular lead R-waves measured 19 mV with an impedance of 480 ohms and a threshold of  0.75 V at 0.5 msec.  Both leads  were secured to the pectoralis fascia using #2-0 silk over the suture sleeves.   Device Placement:  The leads were then connected to a Medtronic Adapta L model ADDRL 1 (serial number [id number]) pacemaker.  The pocket was irrigated with copious gentamicin solution.  The pacemaker was then placed into the pocket.  The pocket was then closed in 2 layers with 2.0 Vicryl suture for the subcutaneous and subcuticular layers.  Steri- Strips and a sterile dressing were then applied.  There were no early apparent complications.     CONCLUSIONS:   1. Successful implantation of a Medtronic Adapta L dual-chamber pacemaker for persistent atrial fibrillation and tachycardia bradycardia syndrome    2. Successful cardioversion to sinus rhythm  3. No early apparent complications.           [name], MD 06/22/2011 12:14 PM

## 2011-06-22 NOTE — Interval H&P Note (Signed)
History and Physical Interval Note:  06/22/2011 10:47 AM  Since last being seen by me in the office, he has decided that he would like to proceed with PPM implantation.  We will therefore proceed with PPM today and also cardioversion.  He reports that his INRs have been chronically therapeutic.  Last 2 INRs in our office have been therapeutic.  He wishes to proceed.   Kenneth Jennings  has presented today for surgery, with the diagnosis of Kenneth Jennings  And atrial fibrillation.  The various methods of treatment have been discussed with the patient and family. After consideration of risks, benefits and other options for treatment, the patient has consented to  Procedure(s):  Cardioversion and PERMANENT PACEMAKER INSERTION as a surgical intervention .  The patients' history has been reviewed, patient examined, no change in status, stable for surgery.  I have reviewed the patients' chart and labs.  Questions were answered to the patient's satisfaction.     Kenneth Jennings

## 2011-06-23 ENCOUNTER — Ambulatory Visit (HOSPITAL_COMMUNITY): Payer: Medicare PPO

## 2011-06-23 LAB — BASIC METABOLIC PANEL
Calcium: 9 mg/dL (ref 8.4–10.5)
Creatinine, Ser: 1.94 mg/dL — ABNORMAL HIGH (ref 0.50–1.35)
GFR calc Af Amer: 38 mL/min — ABNORMAL LOW (ref >90)
GFR calc non Af Amer: 33 mL/min — ABNORMAL LOW (ref >90)
Sodium: 141 mEq/L (ref 135–145)

## 2011-06-23 LAB — PROTIME-INR
INR: 2.78 — ABNORMAL HIGH (ref 0.00–1.49)
Prothrombin Time: 29.8 seconds — ABNORMAL HIGH (ref 11.6–15.2)

## 2011-06-23 LAB — GLUCOSE, CAPILLARY: Glucose-Capillary: 101 mg/dL — ABNORMAL HIGH (ref 70–99)

## 2011-06-23 MED ORDER — HYDROCODONE-ACETAMINOPHEN 5-325 MG PO TABS: 1.0000 | ORAL_TABLET | ORAL | Status: AC | PRN

## 2011-06-23 MED ORDER — WARFARIN SODIUM 10 MG PO TABS: 10.0000 mg | ORAL_TABLET | ORAL | Status: DC

## 2011-06-23 MED ORDER — WARFARIN SODIUM 4 MG PO TABS
8.0000 mg | ORAL_TABLET | ORAL | Status: DC
  Filled 2011-06-23: qty 2

## 2011-06-23 NOTE — Discharge Summary (Signed)
Physician Discharge Summary  Patient ID: Kenneth Jennings MRN: [id number] DOB/AGE: [birth year] 71 y.o.  Admit date: 06/22/2011 Discharge date: 06/23/2011  Primary Discharge Diagnosis:  1. Sick Sinus Syndrome 2. S/P Medtronic Adapta L model ADDRL 1 (serial number [id number]) pacemaker  Secondary Discharge Diagnosis 1. Atrial Fibrillation 2. Hypertension 3. Diabetes 4. Hypercholesterolemia 5. Hx CVA 10 years ago 6. Sleep Apnea 7. Obesity  Significant Procedure: 06/22/2011 Implantation of Medtronic Adapta L model ADDRL 1 (serial number [id number]) pacemaker  Hospital Course: Kenneth Jennings is a 71 y/o patient of Dr. Theron Arista admission and Dr. [name]. The patient has a long-standing history of atrial fibrillation, tachybradycardia syndrome, and sick sinus syndrome. He was seen by Dr. Johney Frame in December 2004 with discussion of need for pacemaker. He wished to wait and think about it. He followed up with Dr. Johney Frame and decided to proceed with pacemaker implantation. INRs have been therapeutic. Therefore he was admitted and had Medtronic Adapta L. model  ADDRL1 placed by Dr. Johney Frame on 06/22/11. He tolerated the procedure well and remained overnight at [facility] he was seen and examined by Dr. Ronny Flurry on a discharge and found to be stable. He will followup in the office to see Dr. Johney Frame in 3 months he will go to the pacemaker wound clinic in 10 days and continue to see Dr.Nishan in approximately 6 months for continued assessment and treatment of coronary artery disease. Written instructions for pacemaker care and activity are provided for the patient on discharge.   Discharge Exam: Blood pressure 142/64, pulse 60, temperature 97.6 F (36.4 C), temperature source Oral, resp. rate 12, height 6' 0.25" (1.835 m), weight 241 lb (109.317 kg), SpO2 94.00%.    Labs:   Lab Results  Component Value Date   WBC 5.2 06/15/2011   HGB 13.9 06/15/2011   HCT 40.1 06/15/2011   MCV 95.8 06/15/2011   PLT 216.0 06/15/2011    Lab 06/23/11 0530  NA 141  K 4.0  CL 104  CO2 27  BUN 27*  CREATININE 1.94*  CALCIUM 9.0  PROT --  BILITOT --  ALKPHOS --  ALT --  AST --  GLUCOSE 104*   Lab Results  Component Value Date   CKTOTAL 208 04/14/2011   CKMB 3.1 04/14/2011   TROPONINI <0.30 04/14/2011    Lab Results  Component Value Date   CHOL  Value: 170        ATP III CLASSIFICATION:  <200     mg/dL   Desirable  962-952  mg/dL   Borderline High  >=841    mg/dL   High        32/44/0102   Lab Results  Component Value Date   HDL 39* 04/20/2010   Lab Results  Component Value Date   LDLCALC  Value: 94        Total Cholesterol/HDL:CHD Risk Coronary Heart Disease Risk Table                     Men   Women  1/2 Average Risk   3.4   3.3  Average Risk       5.0   4.4  2 X Average Risk   9.6   7.1  3 X Average Risk  23.4   11.0        Use the calculated Patient Ratio above and the CHD Risk Table to determine the patient's CHD Risk.  ATP III CLASSIFICATION (LDL):  <100     mg/dL   Optimal  960-454  mg/dL   Near or Above                    Optimal  130-159  mg/dL   Borderline  098-119  mg/dL   High  >147     mg/dL   Very High 82/95/6213   Lab Results  Component Value Date   TRIG 105 04/21/2010   TRIG 184* 04/20/2010   Lab Results  Component Value Date   CHOLHDL 4.4 04/20/2010   No results found for this basename: LDLDIRECT      Radiology: Dg Chest 2 View  06/23/2011  *RADIOLOGY REPORT*  Clinical Data: Evaluate pacemaker placement  CHEST - 2 VIEW  Comparison: 04/14/2011  Findings: A new left anterior chest wall sequential pacemaker has its leads superimposed over the right atrium and right ventricle. There is no pneumothorax.  The nodular density at the left apex is stable.  There are coarse lung base reticular opacities likely scarring, subsegmental atelectasis or a combination.  The lungs are otherwise clear.  The heart, mediastinum and hila are unremarkable.  IMPRESSION: New left anterior  chest wall pacemaker is well positioned.  No pneumothorax.  Original Report Authenticated By: Domenic Moras, M.D.      FOLLOW UP PLANS AND APPOINTMENTS Discharge Orders    Future Appointments: Provider: Department: Dept Phone: Center:   07/30/2011 10:45 AM Gardiner Rhyme, MD [facility] [phone] [facility]     Future Orders Please Complete By Expires   Diet - low sodium heart healthy      Increase activity slowly        Current Discharge Medication List    START taking these medications   Details  HYDROcodone-acetaminophen (NORCO) 5-325 MG per tablet Take 1-2 tablets by mouth every 4 (four) hours as needed. Qty: 30 tablet, Refills: 0      CONTINUE these medications which have NOT CHANGED   Details  amiodarone (PACERONE) 100 MG tablet Take 200 mg by mouth daily.      Ascorbic Acid (VITAMIN C) 500 MG tablet Take 500 mg by mouth daily.     Astaxanthin 4 MG CAPS Take 6 mg by mouth daily.     B Complex Vitamins (VITAMIN B COMPLEX PO) Take 1 tablet by mouth daily.     Cholecalciferol (VITAMIN D3) 1000 UNITS CAPS Take 1 capsule by mouth daily.     CINNAMON PO Take 1 tablet by mouth daily.     Coenzyme Q10 (CO Q 10 PO) Take 1 tablet by mouth daily.      furosemide (LASIX) 40 MG tablet Take 40 mg by mouth daily.      insulin glargine (LANTUS SOLOSTAR) 100 UNIT/ML injection Inject 20 Units into the skin at bedtime.     KRILL OIL PO Take 1 tablet by mouth daily.     latanoprost (XALATAN) 0.005 % ophthalmic solution Place 1 drop into both eyes at bedtime.      losartan (COZAAR) 50 MG tablet Take 50 mg by mouth 2 (two) times daily.      MAGNESIUM CARBONATE PO Take 2 tablets by mouth daily.     timolol (TIMOPTIC) 0.5 % ophthalmic solution Place 1 drop into the right eye 2 (two) times daily.    !! warfarin (COUMADIN) 10 MG tablet Take 10 mg by mouth 3 (three) times a week. As diretced    !! warfarin (COUMADIN)  4 MG tablet Take 8 mg by mouth 4 (four) times a week.       zolpidem (AMBIEN) 10 MG tablet Take 10 mg by mouth at bedtime as needed. For sleep     !! - Potential duplicate medications found. Please discuss with provider.     Follow-up Information    Follow up with [facility]. (Our office will call you for 10 day wound check)       Follow up with [name], MD. (Our office will call you for 3 months follow-up appointment)    Contact information:   [address], [address] [place] [address] [phone]            Time spent with patient to include physician time: 35 minutues  Signed: Joni Reining 06/23/2011, 3:39 PM Co-Sign MD

## 2011-06-23 NOTE — Progress Notes (Signed)
ANTICOAGULATION CONSULT NOTE - Initial Consult  Pharmacy Consult for: Coumadin Indication: atrial fibrillation  Allergies  Allergen Reactions  . Benicar (Olmesartan Medoxomil)     Flushing and Headache   . Amlodipine Anxiety    Patient Measurements: Height: 6' 0.25" (183.5 cm) Weight: 241 lb (109.317 kg) IBW/kg (Calculated) : 78.18    Vital Signs: Temp: 98.3 F (36.8 C) (01/12 0522) Temp src: Oral (01/12 0522) BP: 108/69 mmHg (01/12 0522) Pulse Rate: 58  (01/12 0522)  Labs:  Basename 06/23/11 0530 06/22/11 0849  HGB -- --  HCT -- --  PLT -- --  APTT -- --  LABPROT 29.8* 30.6*  INR 2.78* 2.88*  HEPARINUNFRC -- --  CREATININE 1.94* --  CKTOTAL -- --  CKMB -- --  TROPONINI -- --   Estimated Creatinine Clearance: 44.1 ml/min (by C-G formula based on Cr of 1.94).  Medical History: Past Medical History  Diagnosis Date  . HTN (hypertension)   . Atrial fibrillation     persistent  . Cerebrovascular accident     10 years ago  . Obesity   . Diabetes mellitus   . Somnolence   . Hypercholesteremia   . Insomnia   . Pleural effusion   . Sleep apnea   . Deviated nasal septum   . Atrial enlargement, left   . Tachycardia-bradycardia     Medications:  Prescriptions prior to admission  Medication Sig Dispense Refill  . amiodarone (PACERONE) 100 MG tablet Take 200 mg by mouth daily.        . Ascorbic Acid (VITAMIN C) 500 MG tablet Take 500 mg by mouth daily.       . Astaxanthin 4 MG CAPS Take 6 mg by mouth daily.       . B Complex Vitamins (VITAMIN B COMPLEX PO) Take 1 tablet by mouth daily.       . Cholecalciferol (VITAMIN D3) 1000 UNITS CAPS Take 1 capsule by mouth daily.       Marland Kitchen CINNAMON PO Take 1 tablet by mouth daily.       . Coenzyme Q10 (CO Q 10 PO) Take 1 tablet by mouth daily.        . furosemide (LASIX) 40 MG tablet Take 40 mg by mouth daily.        . insulin glargine (LANTUS SOLOSTAR) 100 UNIT/ML injection Inject 20 Units into the skin at bedtime.         Marland Kitchen KRILL OIL PO Take 1 tablet by mouth daily.       Marland Kitchen latanoprost (XALATAN) 0.005 % ophthalmic solution Place 1 drop into both eyes at bedtime.        Marland Kitchen losartan (COZAAR) 50 MG tablet Take 50 mg by mouth 2 (two) times daily.        Marland Kitchen MAGNESIUM CARBONATE PO Take 2 tablets by mouth daily.       . timolol (TIMOPTIC) 0.5 % ophthalmic solution Place 1 drop into the right eye 2 (two) times daily.      Marland Kitchen warfarin (COUMADIN) 10 MG tablet Take 10 mg by mouth 3 (three) times a week. As diretced      . warfarin (COUMADIN) 4 MG tablet Take 8 mg by mouth 4 (four) times a week.        . zolpidem (AMBIEN) 10 MG tablet Take 10 mg by mouth at bedtime as needed. For sleep       Assessment: 70yom on coumadin pta for afib, now to resume s/p PPM. Per  patient, home dose is 8mg  daily except 10mg  on Sun/Tues/Thurs. Last dose was 1/9. INR is therapeutic today at 2.78. Per MD request,  half of usual dose given 1/11.  Goal of Therapy:  INR 2-3   Plan:  1) Resume home coumadin dosing. 2) Daily INR  Dick Hark Poteet 06/23/2011,10:58 AM

## 2011-06-23 NOTE — Progress Notes (Signed)
Subjective:  Patient doing well after DDD pacer insertion for SSS yesterday.  Presently in atrial paced rhythm at 60/min. Chest xray shows no pneumothorax.  Objective:  Vital Signs in the last 24 hours: Temp:  [97.9 F (36.6 C)-98.3 F (36.8 C)] 98.3 F (36.8 C) (01/12 0522) Pulse Rate:  [58-68] 58  (01/12 0522) Resp:  [12-16] 12  (01/12 0522) BP: (108-141)/(60-72) 108/69 mmHg (01/12 0522) SpO2:  [96 %-98 %] 96 % (01/12 0522)  Intake/Output from previous day: 01/11 0701 - 01/12 0700 In: 410 [P.O.:360; IV Piggyback:50] Out: -  Intake/Output from this shift: Total I/O In: 120 [P.O.:120] Out: -      . acetaminophen  1,000 mg Oral Q6H  . amiodarone  200 mg Oral Daily  . ceFAZolin      . ceFAZolin (ANCEF) IV  1 g Intravenous Q6H  . insulin aspart  0-9 Units Subcutaneous TID WC  . insulin glargine  20 Units Subcutaneous QHS  . latanoprost  1 drop Both Eyes QHS  . losartan  50 mg Oral BID  . midazolam      . mupirocin ointment      . sodium chloride  3 mL Intravenous Q12H  . timolol  1 drop Right Eye BID  . warfarin  10 mg Oral Custom  . warfarin  4 mg Oral Once  . warfarin  8 mg Oral Custom  . DISCONTD: chlorhexidine  60 mL Topical Once  . DISCONTD: gentamicin irrigation  80 mg Irrigation On Call  . DISCONTD: mupirocin ointment   Nasal Once  . DISCONTD: timolol  5 mg Oral Daily      . DISCONTD: sodium chloride    . DISCONTD: sodium chloride 50 mL/hr at 06/22/11 0915    Physical Exam: The patient appears to be in no distress.  Head and neck exam reveals that the pupils are equal and reactive.  The extraocular movements are full.  There is no scleral icterus.  Mouth and pharynx are benign.  No lymphadenopathy.  No carotid bruits.  The jugular venous pressure is normal.  Thyroid is not enlarged or tender.  Chest is clear to percussion and auscultation.  No rales or rhonchi.  Expansion of the chest is symmetrical.  The pacer site looks good.  No tenderness or  significant bleeding.  Heart reveals no abnormal lift or heave.  First and second heart sounds are normal.  There is no murmur gallop rub or click.  The abdomen is soft and nontender.  Bowel sounds are normoactive.  There is no hepatosplenomegaly or mass.  There are no abdominal bruits.  Extremities reveal no phlebitis or edema.  Pedal pulses are good.  There is no cyanosis or clubbing.  Neurologic exam is normal strength and no lateralizing weakness.  No sensory deficits.  Integument reveals no rash  Lab Results: No results found for this basename: WBC:2,HGB:2,PLT:2 in the last 72 hours  Basename 06/23/11 0530  NA 141  K 4.0  CL 104  CO2 27  GLUCOSE 104*  BUN 27*  CREATININE 1.94*   No results found for this basename: TROPONINI:2,CK,MB:2 in the last 72 hours Hepatic Function Panel No results found for this basename: PROT,ALBUMIN,AST,ALT,ALKPHOS,BILITOT,BILIDIR,IBILI in the last 72 hours No results found for this basename: CHOL in the last 72 hours No results found for this basename: PROTIME in the last 72 hours  Imaging: Imaging results have been reviewed  Cardiac Studies:  Assessment/Plan:  Patient Active Hospital Problem List: Atrial fibrillation (10/05/2009)  Assessment: Maintaining atrial paced rhythm   Plan: Continue amiodarone, warfarin. 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   Plan: Discharge home today with wound care instructions.     Wound check in about 1 week.   LOS: 1 day    Cassell Clement 06/23/2011, 11:38 AM

## 2011-06-25 ENCOUNTER — Telehealth: Payer: Self-pay | Admitting: Internal Medicine

## 2011-06-25 NOTE — Telephone Encounter (Signed)
New Problem:    Patient called because he is experiencing some bleeding form the site of his pacer implant surgery.  Also claims that someone called him to schedule an appointment to get his pacemaker checked.  Please call back.

## 2011-06-25 NOTE — Telephone Encounter (Signed)
Spoke w/pt--blood is dried on strips. Pt is in pain and taking Hydrocodone for pain. Scheduled pt for wound check for 07-04-11 @ 930. isntructed to call if any other problems. Pt understands. Pt was upset that was not able to talk with someone when first called. Phone number to device clinic was given and told that might have to leave message but we would get back with him as soon as possible.

## 2011-06-26 ENCOUNTER — Telehealth: Payer: Self-pay | Admitting: Cardiovascular Disease

## 2011-06-26 ENCOUNTER — Other Ambulatory Visit: Payer: Self-pay | Admitting: Cardiovascular Disease

## 2011-06-26 NOTE — Telephone Encounter (Signed)
Ok to stop amiodarone 

## 2011-06-26 NOTE — Telephone Encounter (Signed)
New Problem   Please return call to patient @ home # to discuss care.  Patient will be available after 3pm

## 2011-06-26 NOTE — Telephone Encounter (Signed)
PT CALLED THOUGHT THAT DR Eden Emms  WANTED HIM TO STOP AMIODARONE AFTER  DEVICE WAS IMPLANTED  PT AWARE WILL FORWARD TO DR Eden Emms FOR REVIEW JUST HAD DEVICE IMPLANTED ON  06/22/11 .Kenneth Jennings

## 2011-06-27 ENCOUNTER — Other Ambulatory Visit: Payer: Self-pay | Admitting: Cardiovascular Disease

## 2011-06-27 MED ORDER — LOSARTAN POTASSIUM 50 MG PO TABS: 50.0000 mg | ORAL_TABLET | Freq: Two times a day (BID) | ORAL | Status: AC

## 2011-06-27 NOTE — Telephone Encounter (Signed)
PT AWARE OKAY TO STOP AMIODARONE .Kenneth Jennings

## 2011-07-04 ENCOUNTER — Encounter: Payer: Self-pay | Admitting: Internal Medicine

## 2011-07-04 ENCOUNTER — Telehealth: Payer: Self-pay | Admitting: Cardiovascular Disease

## 2011-07-04 ENCOUNTER — Ambulatory Visit (INDEPENDENT_AMBULATORY_CARE_PROVIDER_SITE_OTHER): Payer: Medicare PPO | Admitting: *Deleted

## 2011-07-04 DIAGNOSIS — I4891 Unspecified atrial fibrillation: Secondary | ICD-10-CM

## 2011-07-04 DIAGNOSIS — I495 Sick sinus syndrome: Secondary | ICD-10-CM

## 2011-07-04 LAB — PACEMAKER DEVICE OBSERVATION
AL AMPLITUDE: 5.6 mv
AL THRESHOLD: 0.75 V
RV LEAD AMPLITUDE: 15.68 mv
RV LEAD IMPEDENCE PM: 641 Ohm
RV LEAD THRESHOLD: 1.75 V

## 2011-07-04 NOTE — Telephone Encounter (Signed)
New Msg: Pt calling wanting to speak with nurse regarding taking sleep aide somnapure. Please return pt call to discuss further.

## 2011-07-04 NOTE — Telephone Encounter (Signed)
PT WOULD LIKE TO TRY OTC SLEEP AIDE  SOMNAPURE   NOTIFIED PT WOULD DISCUSS WITH [facility] .  DISCUSSED  WITH SALLY THE ABOVE TOPIC . PER SALLY  NO RESEARCH OR  DOCUMENTATION  ON HERBAL MEDS  DOES NOT  THINK WOULD INTERFERE WITH  CURRENT MEDS AS LONG AS AMBIEN IS STOPPED AND NOT TAKE IN ADDITION PT  AWARE  IF TAKES MED WOULD BE AT HIS OWN RISK.

## 2011-07-04 NOTE — Progress Notes (Signed)
Wound check pacer check

## 2011-07-05 IMAGING — CR DG CHEST 1V PORT
1 series · 1 of 1 positions shown · non-contrast
Comparison: 05/15/2010.

CLINICAL DATA: Left-sided pleural effusion.

PORTABLE CHEST - 1 VIEW

[view not recorded]
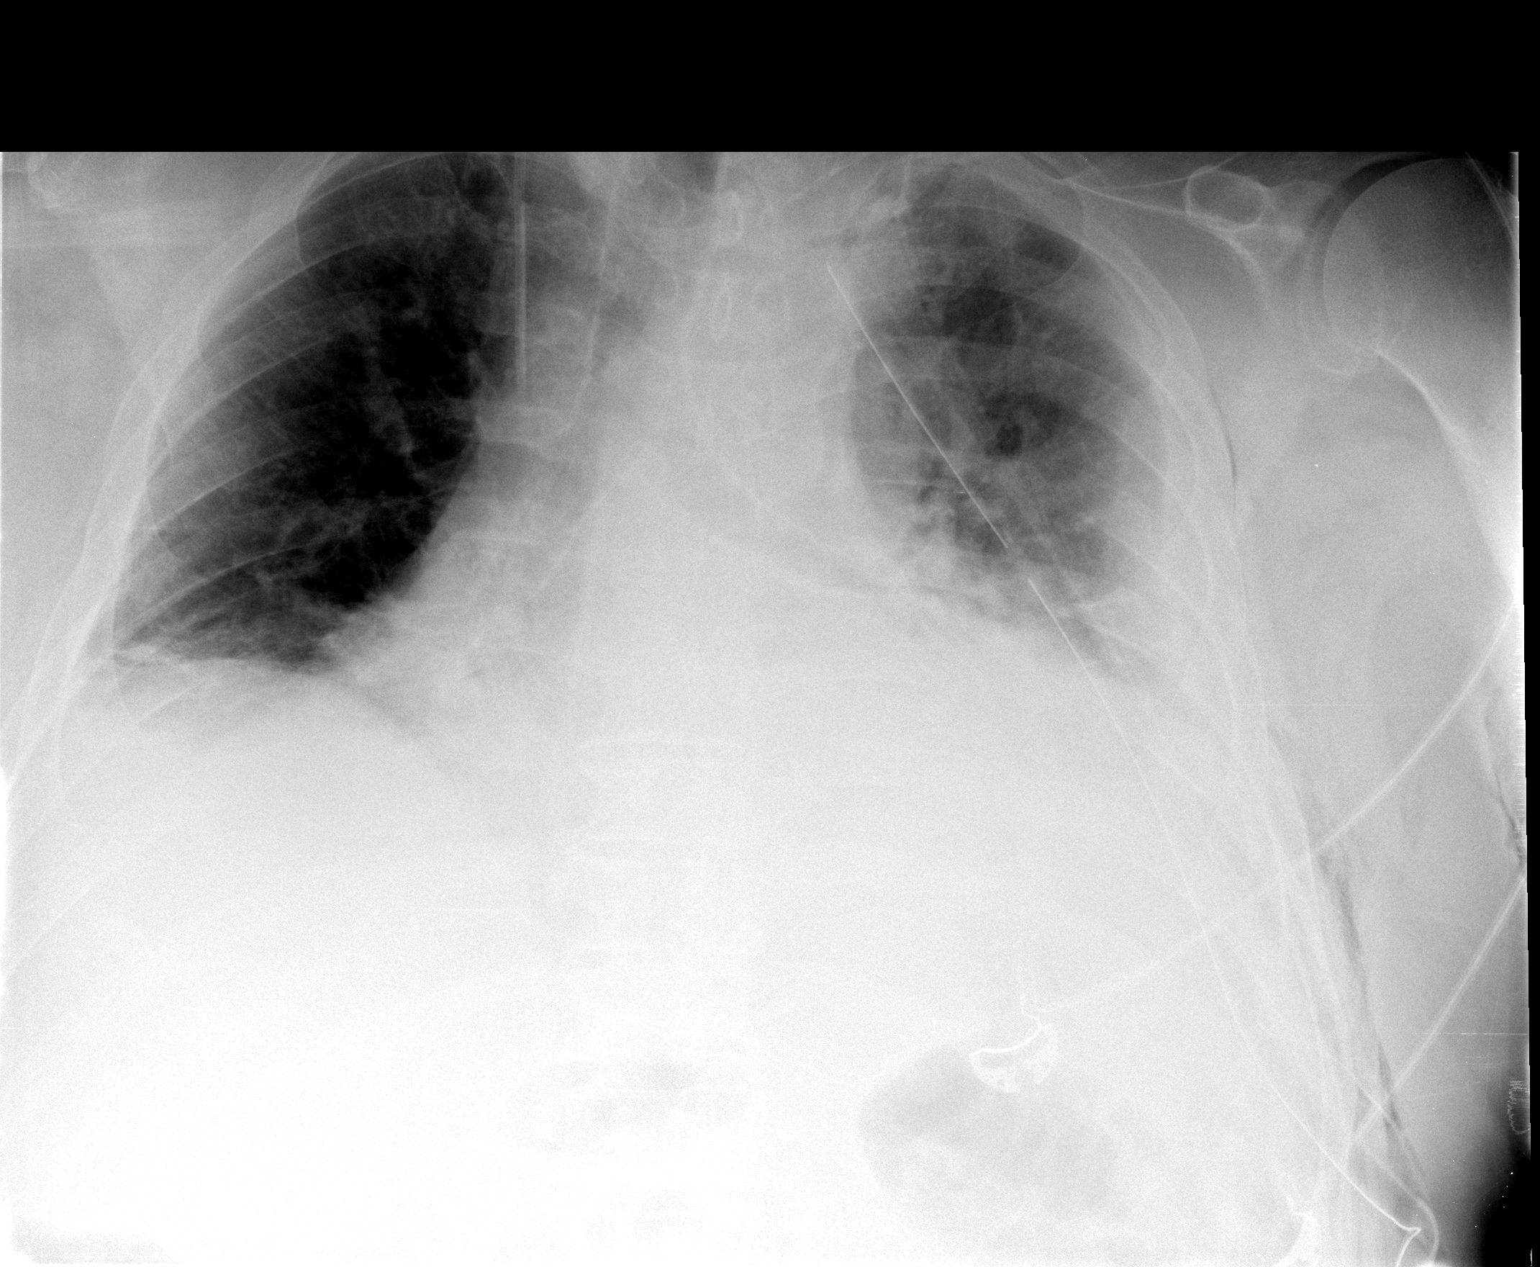

[1 of 1 positions shown; findings below may reference images not displayed]

FINDINGS: Right central line tip proximal to mid superior vena
cava.  Left chest tube is in place.  Decrease in size of tiny left
apical pneumothorax.  Small amount of left chest wall subcutaneous
emphysema.

Cardiomegaly.  Pulmonary vascular congestion.  Left-sided pleural
effusion/atelectasis.  Minimal right base atelectasis.
IMPRESSION: Decrease in size of tiny left apical pneumothorax.

Cardiomegaly.  Pericardial effusion cannot be excluded.

Left-sided pleural effusion and atelectasis.

Right base subsegmental atelectasis.

## 2011-07-13 ENCOUNTER — Telehealth: Payer: Self-pay | Admitting: *Deleted

## 2011-07-13 NOTE — Telephone Encounter (Signed)
PHARMACY AWARE AMIODARONE WAS DISCONTINUED  PT LONGER  ON MED DOES NOT NEED REFILLED./CY

## 2011-07-24 ENCOUNTER — Telehealth: Payer: Self-pay | Admitting: Internal Medicine

## 2011-07-24 NOTE — Telephone Encounter (Signed)
New Problem:     Patient complained of having pains from his device site and wanted to be seen sooner than his appointment on 09/26/11. But he didn't seem like he wanted to be seen with the first soonest appointments I had available.  Please call back.

## 2011-07-24 NOTE — Telephone Encounter (Signed)
lmom for patient to call me back. 

## 2011-07-24 NOTE — Telephone Encounter (Signed)
Fu call °Pt was returning your call °

## 2011-07-24 NOTE — Telephone Encounter (Signed)
Spoke with patient he is having pain in left shoulder and when he walks his HR goes up.  Sharpe pain every now and then around the site  This was going on prior to device implant but he feels it is worse and more constant now.  He is also wondering if he can resume bowling

## 2011-07-25 NOTE — Telephone Encounter (Signed)
Dr Johney Frame suggested waiting 6 weeks to restart bowling.  He states he feels terrible.  I am going to have him seen by Dr Johney Frame tomorrow at 8:45am

## 2011-07-26 ENCOUNTER — Ambulatory Visit (INDEPENDENT_AMBULATORY_CARE_PROVIDER_SITE_OTHER): Payer: Medicare PPO | Admitting: Internal Medicine

## 2011-07-26 ENCOUNTER — Encounter: Payer: Self-pay | Admitting: Internal Medicine

## 2011-07-26 ENCOUNTER — Ambulatory Visit (HOSPITAL_COMMUNITY)
Admission: RE | Admit: 2011-07-26 | Discharge: 2011-07-26 | Disposition: A | Discharge status: Home / Self Care | Payer: Medicare PPO | Source: Ambulatory Visit | Attending: Internal Medicine | Admitting: Internal Medicine

## 2011-07-26 VITALS — BP 142/62 | HR 82 | Ht 72.0 in | Wt 241.8 lb

## 2011-07-26 DIAGNOSIS — I495 Sick sinus syndrome: Secondary | ICD-10-CM

## 2011-07-26 DIAGNOSIS — Z950 Presence of cardiac pacemaker: Secondary | ICD-10-CM | POA: Insufficient documentation

## 2011-07-26 DIAGNOSIS — R079 Chest pain, unspecified: Secondary | ICD-10-CM | POA: Insufficient documentation

## 2011-07-26 LAB — PACEMAKER DEVICE OBSERVATION
AL IMPEDENCE PM: 551 Ohm
ATRIAL PACING PM: 100
BAMS-0001: 150 {beats}/min
BATTERY VOLTAGE: 2.81 V
BRDY-0002RV: 60 {beats}/min
RV LEAD IMPEDENCE PM: 625 Ohm
VENTRICULAR PACING PM: 0

## 2011-07-26 NOTE — Progress Notes (Signed)
PCP:  Nehemiah Massed., MD, MD Primary Cardiologist:  Dr Eden Emms  The patient presents today for post op followup.  He reports having pain in his L shoulder and neck.  Denies chest pain or SOB.    Past Medical History  Diagnosis Date  . HTN (hypertension)   . Atrial fibrillation     persistent  . Cerebrovascular accident     10 years ago  . Obesity   . Diabetes mellitus   . Somnolence   . Hypercholesteremia   . Insomnia   . Pleural effusion   . Sleep apnea   . Deviated nasal septum   . Atrial enlargement, left   . Tachycardia-bradycardia     s/p PPM by Dr Johney Frame   Past Surgical History  Procedure Date  . 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 Lung procedure   . Pacemaker insertion 06/22/11    MDT PPM by Dr Johney Frame for tachy/brady syndrome    Current Outpatient Prescriptions  Medication Sig Dispense Refill  . Ascorbic Acid (VITAMIN C) 500 MG tablet Take 500 mg by mouth daily.       . Astaxanthin 4 MG CAPS Take 6 mg by mouth daily.       . B Complex Vitamins (VITAMIN B COMPLEX PO) Take 1 tablet by mouth daily.       . Cholecalciferol (VITAMIN D PO) Take 1 tablet by mouth daily.      Marland Kitchen CINNAMON PO Take 1 tablet by mouth daily.       . Coenzyme Q10 (CO Q 10 PO) Take 1 tablet by mouth daily.        . furosemide (LASIX) 40 MG tablet Take 40 mg by mouth daily.        . insulin glargine (LANTUS SOLOSTAR) 100 UNIT/ML injection Inject 20 Units into the skin at bedtime.       Marland Kitchen KRILL OIL PO Take 1 tablet by mouth daily.       Marland Kitchen latanoprost (XALATAN) 0.005 % ophthalmic solution Place 1 drop into both eyes at bedtime.        Marland Kitchen losartan (COZAAR) 50 MG tablet Take 1 tablet (50 mg total) by mouth 2 (two) times daily.  180 tablet  3  . MAGNESIUM CARBONATE PO Take 2 tablets by mouth daily.       . timolol (TIMOPTIC) 0.5 % ophthalmic solution Place 1 drop into the right eye 2 (two) times daily.      Marland Kitchen warfarin (COUMADIN) 10 MG tablet Take 10 mg by mouth 3 (three) times a week. As  diretced      . warfarin (COUMADIN) 4 MG tablet Take 8 mg by mouth 4 (four) times a week.        . zolpidem (AMBIEN) 10 MG tablet Take 10 mg by mouth at bedtime as needed. For sleep        Allergies  Allergen Reactions  . Benicar (Olmesartan Medoxomil)     Flushing and Headache   . Amlodipine Anxiety    History   Social History  . Marital Status: Married    Spouse Name: N/A    Number of Children: N/A  . Years of Education: N/A   Occupational History  . Not on file.   Social History Main Topics  . Smoking status: Former Smoker    Quit date: 06/12/1999  . Smokeless tobacco: Never Used  . Alcohol Use: Yes     rare  . Drug Use: No  .  Sexually Active: Not on file   Other Topics Concern  . Not on file   Social History Narrative   RetiredRaising his 2 grandchildren as his son is an Immunologist and his wife commited suicideLikes to golf and watch [place] sports    Family History  Problem Relation Age of Onset  . Coronary artery disease     Physical Exam: Filed Vitals:   07/26/11 0850  BP: 142/62  Pulse: 82  Height: 6' (1.829 m)  Weight: 241 lb 12.8 oz (109.68 kg)    GEN- The patient is well appearing, alert and oriented x 3 today.   Head- normocephalic, atraumatic Eyes-  Sclera clear, conjunctiva pink Ears- hearing intact Oropharynx- clear Neck- supple, no JVP Lymph- no cervical lymphadenopathy Lungs- Clear to ausculation bilaterally, normal work of breathing Chest- pacemaker pocket is well healed Heart- Regular rate and rhythm, no murmurs, rubs or gallops, PMI not laterally displaced GI- soft, NT, ND, + BS Extremities- no clubbing, cyanosis, or edema Chest- pacemaker site is well healed Tenderness over L deltopectoral groove, no tenderness of pacer site  Limited echo this am reveals no effusion   Pacemaker interrogation- reviewed in detail today,  See PACEART report  Assessment and Plan:

## 2011-07-26 NOTE — Patient Instructions (Signed)
Your physician recommends that you schedule a follow-up appointment as scheduled   A chest x-ray takes a picture of the organs and structures inside the chest, including the heart, lungs, and blood vessels. This test can show several things, including, whether the heart is enlarges; whether fluid is building up in the lungs; and whether pacemaker / defibrillator leads are still in place.

## 2011-07-26 NOTE — Assessment & Plan Note (Signed)
Normal pacemaker function See Arita Miss Art report No changes today  Will check CXR to insure lead stability Limited echo reveals no effusion  Routine wound care I have encouraged ROM exercises and regular L arm use to prevent arthritis within the joint Follow-up with PCP if not improved   Continue coumadin indefinitely

## 2011-07-27 IMAGING — CR DG CHEST 2V
2 series · 2 of 2 positions shown · non-contrast
Comparison: [HOSPITAL] at [REDACTED] x-ray
05/24/2010.

CLINICAL DATA: Pleura desist left lung 05/15/2010 follow-up pleural
effusion.

CHEST - 2 VIEW

[w chest pa]
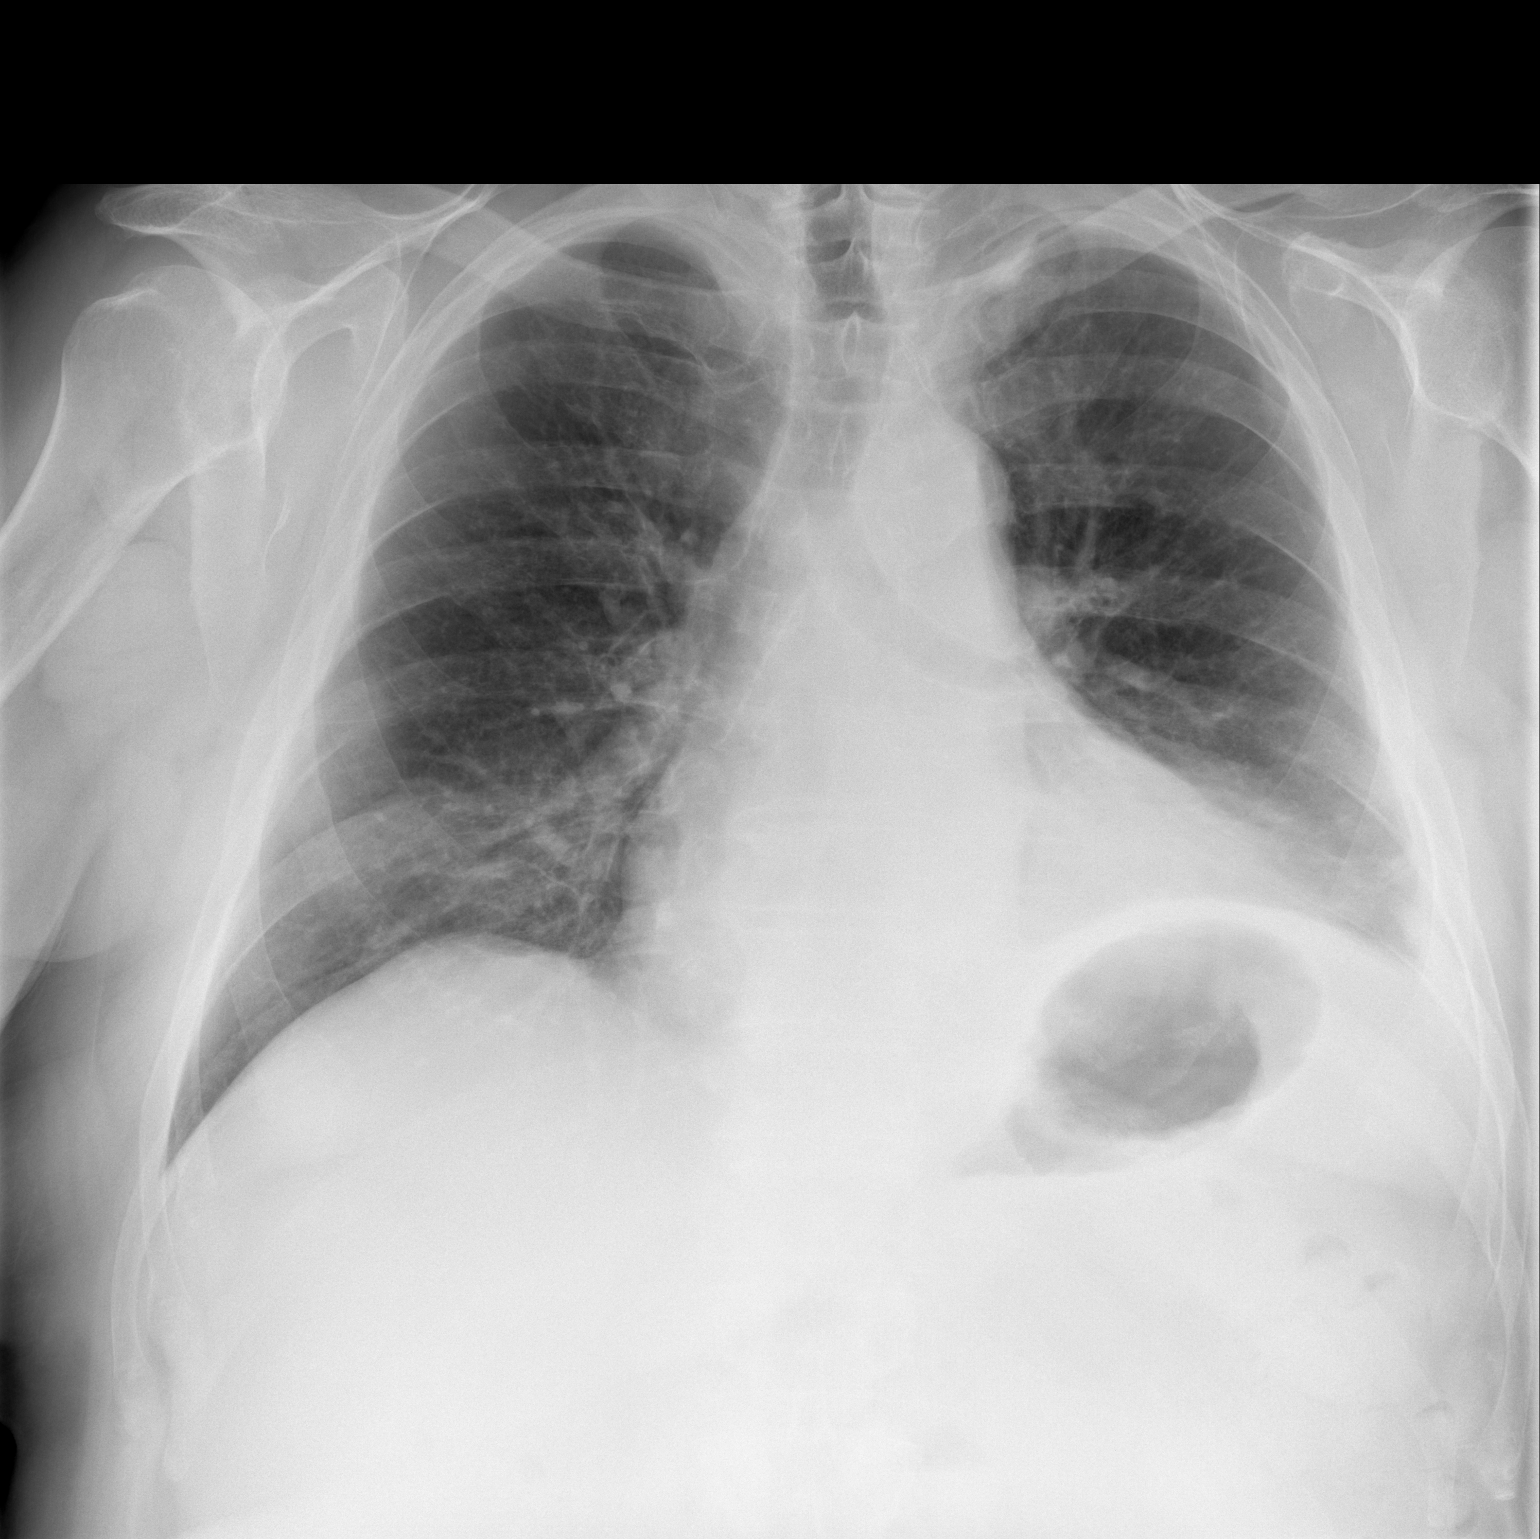

[w chest lat]
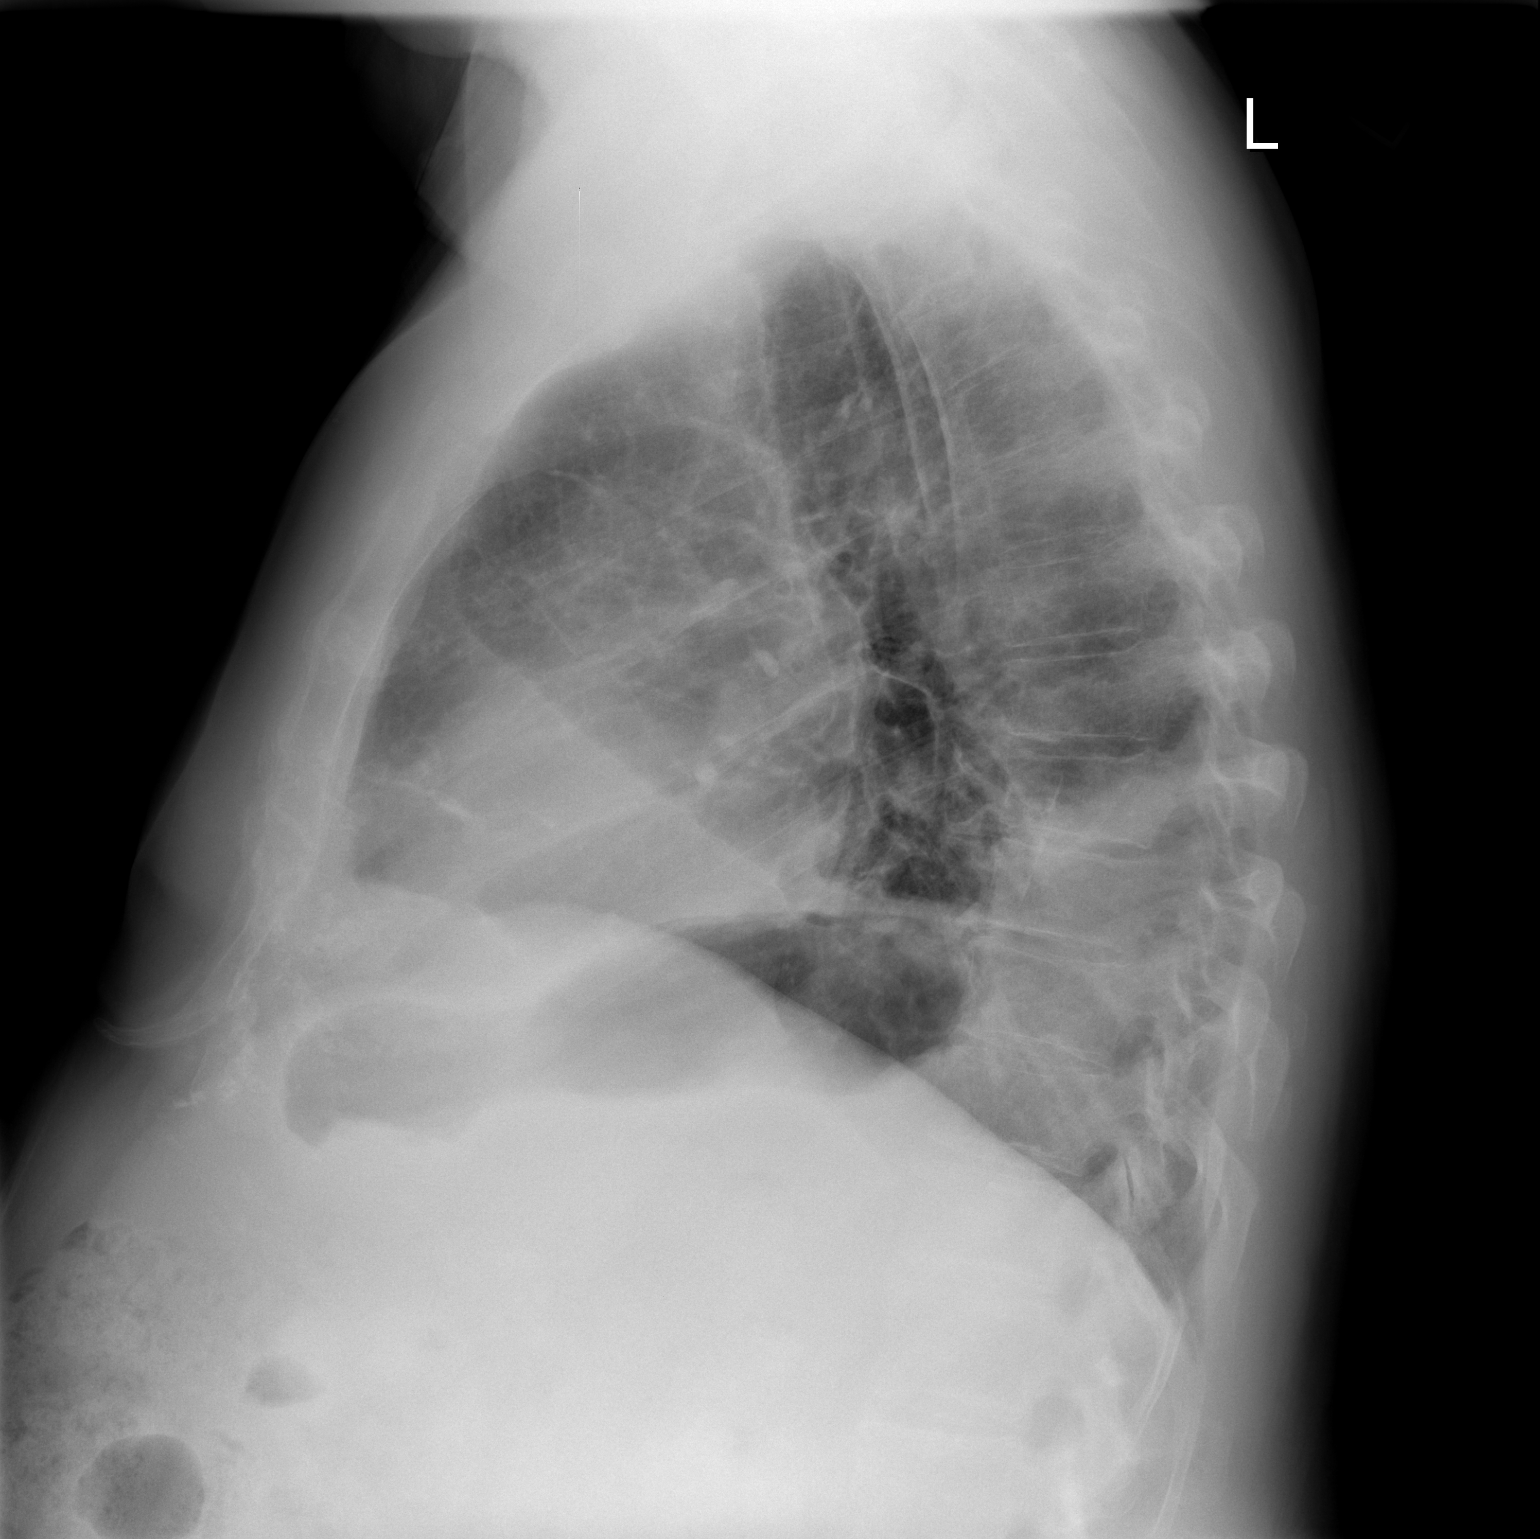

[2 of 2 positions shown; findings below may reference images not displayed]

FINDINGS: Interval removal of small caliber left chest tube without
pneumothorax visualized.  Stable elevation left hemidiaphragm with
slight left inferior pleural fluid and slight diffuse left pleural
thickening visualized.  Lungs otherwise clear with normal heart
size upper normal.  Mediastinum, hila, remaining pleura and osseous
structures are stable and unremarkable.
IMPRESSION: 1.  Interval removal previous small caliber left chest tube without
pneumothorax.
2.  Stable elevation left hemidiaphragm with slight left pleural
fluid/reaction.
3.  No interval acute findings.

## 2011-07-28 ENCOUNTER — Encounter (HOSPITAL_BASED_OUTPATIENT_CLINIC_OR_DEPARTMENT_OTHER): Payer: Self-pay

## 2011-07-28 ENCOUNTER — Emergency Department (HOSPITAL_BASED_OUTPATIENT_CLINIC_OR_DEPARTMENT_OTHER)
Admission: EM | Admit: 2011-07-28 | Discharge: 2011-07-28 | Disposition: A | Discharge status: Home / Self Care | Payer: Medicare PPO | Attending: Emergency Medicine | Admitting: Emergency Medicine

## 2011-07-28 ENCOUNTER — Emergency Department (INDEPENDENT_AMBULATORY_CARE_PROVIDER_SITE_OTHER): Payer: Medicare PPO

## 2011-07-28 DIAGNOSIS — G319 Degenerative disease of nervous system, unspecified: Secondary | ICD-10-CM | POA: Insufficient documentation

## 2011-07-28 DIAGNOSIS — R51 Headache: Secondary | ICD-10-CM

## 2011-07-28 DIAGNOSIS — I4891 Unspecified atrial fibrillation: Secondary | ICD-10-CM | POA: Insufficient documentation

## 2011-07-28 DIAGNOSIS — R110 Nausea: Secondary | ICD-10-CM | POA: Insufficient documentation

## 2011-07-28 DIAGNOSIS — R030 Elevated blood-pressure reading, without diagnosis of hypertension: Secondary | ICD-10-CM

## 2011-07-28 DIAGNOSIS — Z8679 Personal history of other diseases of the circulatory system: Secondary | ICD-10-CM | POA: Insufficient documentation

## 2011-07-28 DIAGNOSIS — Z79899 Other long term (current) drug therapy: Secondary | ICD-10-CM | POA: Insufficient documentation

## 2011-07-28 DIAGNOSIS — E119 Type 2 diabetes mellitus without complications: Secondary | ICD-10-CM

## 2011-07-28 DIAGNOSIS — I10 Essential (primary) hypertension: Secondary | ICD-10-CM | POA: Insufficient documentation

## 2011-07-28 DIAGNOSIS — E669 Obesity, unspecified: Secondary | ICD-10-CM | POA: Insufficient documentation

## 2011-07-28 DIAGNOSIS — E7800 Pure hypercholesterolemia, unspecified: Secondary | ICD-10-CM | POA: Insufficient documentation

## 2011-07-28 LAB — DIFFERENTIAL
Basophils Absolute: 0.1 10*3/uL (ref 0.0–0.1)
Basophils Relative: 1 % (ref 0–1)
Eosinophils Absolute: 0.1 10*3/uL (ref 0.0–0.7)
Eosinophils Relative: 2 % (ref 0–5)
Monocytes Absolute: 0.7 10*3/uL (ref 0.1–1.0)
Neutro Abs: 4.4 10*3/uL (ref 1.7–7.7)

## 2011-07-28 LAB — CBC
HCT: 38.4 % — ABNORMAL LOW (ref 39.0–52.0)
MCHC: 36.7 g/dL — ABNORMAL HIGH (ref 30.0–36.0)
MCV: 88.5 fL (ref 78.0–100.0)
RDW: 12.2 % (ref 11.5–15.5)

## 2011-07-28 LAB — BASIC METABOLIC PANEL
BUN: 17 mg/dL (ref 6–23)
Chloride: 94 mEq/L — ABNORMAL LOW (ref 96–112)
Creatinine, Ser: 1.6 mg/dL — ABNORMAL HIGH (ref 0.50–1.35)
GFR calc Af Amer: 48 mL/min — ABNORMAL LOW (ref >90)

## 2011-07-28 MED ORDER — OXYCODONE-ACETAMINOPHEN 5-325 MG PO TABS: 1.0000 | ORAL_TABLET | ORAL | Status: AC | PRN

## 2011-07-28 MED ORDER — METOCLOPRAMIDE HCL 5 MG/ML IJ SOLN
10.0000 mg | Freq: Once | INTRAMUSCULAR | Status: AC
  Administered 2011-07-28: 10 mg via INTRAVENOUS
  Filled 2011-07-28: qty 2

## 2011-07-28 MED ORDER — MORPHINE SULFATE 4 MG/ML IJ SOLN
4.0000 mg | Freq: Once | INTRAMUSCULAR | Status: AC
  Administered 2011-07-28: 4 mg via INTRAVENOUS
  Filled 2011-07-28: qty 1

## 2011-07-28 NOTE — ED Notes (Signed)
Pt c/o HA for past 2-3 days.  Pt taking vicodin for pain at home.  Pt states he recently completed course of Doxy for sinus infection.  Pt recently had pacer placed.  Pt states he feels intermittently nauseated, denies emesis.  Denies changes in vision.

## 2011-07-28 NOTE — Discharge Instructions (Signed)
Headache and Allergies The relationship between allergies and headaches is unclear. Many people with allergic or infectious nasal problems also have headaches (migraines or sinus headaches). However, sometimes allergies can cause pressure that feels like a headache, and sometimes headaches can cause allergy-like symptoms. It is not always clear whether your symptoms are caused by allergies or by a headache. CAUSES   Migraine: The cause of a migraine is not always known.   Sinus Headache: The cause of a sinus headache may be a sinus infection. Other conditions that may be related to sinus headaches include:   Hay fever (allergic rhinitis).   Deviation of the nasal septum.   Swelling or clogging of the nasal passages.  SYMPTOMS  Migraine headache symptoms (which often last 4 to 72 hours) include:  Intense, throbbing pain on one or both sides of the head.   Nausea.   Vomiting.   Being extra sensitive to light.   Being extra sensitive to sound.   Nervous system reactions that appear similar to an allergic reaction:   Stuffy nose.   Runny nose.   Tearing.  Sinus headaches are felt as facial pain or pressure.  DIAGNOSIS  Because there is some overlap in symptoms, sinus and migraine headaches are often misdiagnosed. For example, a person with migraines may also feel facial pressure. Likewise, many people with hay fever may get migraine headaches rather than sinus headaches. These migraines can be triggered by the histamine release during an allergic reaction. An antihistamine medicine can eliminate this pain. There are standard criteria that help clarify the difference between these headaches and related allergy or allergy-like symptoms. Your caregiver can use these criteria to determine the proper diagnosis and provide you the best care. TREATMENT  Migraine medicine may help people who have persistent migraine headaches even though their hay fever is controlled. For some people,  anti-inflammatory treatments do not work to relieve migraines. Medicines called triptans (such as sumatriptan) can be helpful for those people. Document Released: 08/18/2003 Document Revised: 02/07/2011 Document Reviewed: 09/09/2009 [facility] Patient Information 2012 [place], [place].  Stop Vicodin while taking the new medicine prescribed today for pain. Your blood pressure should be rechecked within the next week. Call your Doctor to schedule an appointment in 2 days. Return if her condition worsens for any reason

## 2011-07-28 NOTE — ED Provider Notes (Signed)
History    Scribed for Doug Sou, MD, the patient was seen in room MH05/MH05. This chart was scribed by Katha Cabal.   CSN: [id number]  Arrival date & time 07/28/11  1721   First MD Initiated Contact with Patient 07/28/11 1917      Chief Complaint  Patient presents with  . Headache    (Consider location/radiation/quality/duration/timing/severity/associated sxs/prior treatment) HPI Kenneth Jennings is a 71 y.o. male who presents to the Emergency Department complaining of frontal headache over  the past 3 days.  Pain also in occipital area at times.  Pain waxes and wanes.  Pain same as yesterday. Patient had vague headache approximately one week ago and treated with doxycycline for "sinusitis" which she stopped after a few days. Denies fever Symptoms are associated with nausea and trouble with balance.  Symptoms are not associated with fever, visual disturbance.  Patient reports seeing PMD for headache a week and a half ago.  Patient given doxycycline.  Patient taking hydrocodone for pain.  Reports worsening with pain medication.  Nothing makes pain worse.  Some relief with lights turned out.  Coumadin for atrial fibrillation.   Patient had pacemaker placed 5 weeks ago.  Last INR was 2.7 a week ago.      PCP Nehemiah Massed., MD, MD      Past Medical History  Diagnosis Date  . HTN (hypertension)   . Atrial fibrillation     persistent  . Cerebrovascular accident     10 years ago  . Obesity   . Diabetes mellitus   . Somnolence   . Hypercholesteremia   . Insomnia   . Pleural effusion   . Sleep apnea   . Deviated nasal septum   . Atrial enlargement, left   . Tachycardia-bradycardia     s/p PPM by Dr Johney Frame    Past Surgical History  Procedure Date  . 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 Lung procedure   . Pacemaker insertion 06/22/11    MDT PPM by Dr Johney Frame for tachy/brady syndrome    Family History  Problem Relation Age of Onset  . Coronary  artery disease      History  Substance Use Topics  . Smoking status: Former Smoker    Quit date: 06/12/1999  . Smokeless tobacco: Never Used  . Alcohol Use: Yes     rare  Patient reports quitting smoking a couple years ago.  Drinks wine occasionally.      Review of Systems  Constitutional: Negative.   Respiratory: Negative.   Cardiovascular: Negative.   Gastrointestinal: Positive for nausea.  Musculoskeletal: Negative.   Skin: Negative.   Neurological: Positive for headaches.  Hematological: Negative.   Psychiatric/Behavioral: Negative.     Allergies  Benicar and Amlodipine  Home Medications   Current Outpatient Rx  Name Route Sig Dispense Refill  . VITAMIN C 500 MG PO TABS Oral Take 500 mg by mouth daily.     . ASTAXANTHIN 4 MG PO CAPS Oral Take 6 mg by mouth daily.     Marland Kitchen VITAMIN B COMPLEX PO Oral Take 1 tablet by mouth daily.     Marland Kitchen VITAMIN D PO Oral Take 1 tablet by mouth daily.    Marland Kitchen CINNAMON PO Oral Take 1 tablet by mouth daily.     . CO Q 10 PO Oral Take 1 tablet by mouth daily.      . FUROSEMIDE 40 MG PO TABS Oral Take 40 mg by mouth daily.      Marland Kitchen  HYDROCODONE-ACETAMINOPHEN 5-325 MG PO TABS Oral Take 1-2 tablets by mouth every 4 (four) hours as needed. For pain    . INSULIN GLARGINE 100 UNIT/ML [place] SOLN Subcutaneous Inject 20 Units into the skin at bedtime.     Marland Kitchen KRILL OIL PO Oral Take 1 tablet by mouth daily.     Marland Kitchen LATANOPROST 0.005 % OP SOLN Both Eyes Place 1 drop into both eyes at bedtime.      Marland Kitchen LORATADINE 10 MG PO TABS Oral Take 10 mg by mouth daily.    Marland Kitchen LOSARTAN POTASSIUM 50 MG PO TABS Oral Take 1 tablet (50 mg total) by mouth 2 (two) times daily. 180 tablet 3  . MAGNESIUM CARBONATE PO Oral Take 2 tablets by mouth daily.     Marland Kitchen TIMOLOL MALEATE 0.5 % OP SOLN Right Eye Place 1 drop into the right eye 2 (two) times daily.    . WARFARIN SODIUM 10 MG PO TABS Oral Take 10 mg by mouth 3 (three) times a week. As diretced    . WARFARIN SODIUM 4 MG PO TABS Oral Take 8  mg by mouth daily.     Marland Kitchen ZOLPIDEM TARTRATE 10 MG PO TABS Oral Take 10 mg by mouth at bedtime as needed. For sleep      BP 172/52  Pulse 64  Temp(Src) 98.3 F (36.8 C) (Oral)  Resp 17  Ht 6' (1.829 m)  Wt 235 lb (106.595 kg)  BMI 31.87 kg/m2  SpO2 96%  Physical Exam  Nursing note and vitals reviewed. Constitutional: He appears well-developed and well-nourished.  HENT:  Head: Normocephalic and atraumatic.  Eyes: Conjunctivae are normal. Pupils are equal, round, and reactive to light.       Patient wears glasses,   Neck: Neck supple. No tracheal deviation present. No thyromegaly present.  Cardiovascular: Normal rate and regular rhythm.   No murmur heard. Pulmonary/Chest: Effort normal and breath sounds normal.  Abdominal: Soft. Bowel sounds are normal. He exhibits no distension. There is no tenderness.  Musculoskeletal: Normal range of motion. He exhibits no edema and no tenderness.  Neurological: He is alert. Coordination normal.  Skin: Skin is warm and dry. No rash noted.  Psychiatric: He has a normal mood and affect.   neurologic: gait normal pronator drift normal Romberg normal Eyes fundi not well visualized  ED Course  Procedures (including critical care time) 9:20 PM patient feels improved after treatment with intravenous morphine  DIAGNOSTIC STUDIES: Oxygen Saturation is 97% on room air, normal by my interpretation.     COORDINATION OF CARE: 7:29 PM  Physical exam complete.  Will order CT Head.   8:26 PM  Recheck.  Patient reports no improvement.  9:15 PM  Advised patient of laboratory and radiological findings.      LABS / RADIOLOGY:   Labs Reviewed  BASIC METABOLIC PANEL - Abnormal; Notable for the following:    Sodium 133 (*)    Chloride 94 (*)    Glucose, Bld 136 (*)    Creatinine, Ser 1.60 (*)    GFR calc non Af Amer 41 (*)    GFR calc Af Amer 48 (*)    All other components within normal limits  CBC - Abnormal; Notable for the following:    HCT  38.4 (*)    MCHC 36.7 (*)    All other components within normal limits  PROTIME-INR - Abnormal; Notable for the following:    Prothrombin Time 23.4 (*)    INR 2.04 (*)  All other components within normal limits  SEDIMENTATION RATE - Abnormal; Notable for the following:    Sed Rate 20 (*)    All other components within normal limits  DIFFERENTIAL   Ct Head Wo Contrast  07/28/2011  *RADIOLOGY REPORT*  Clinical Data: Frontal headaches for 3 days.  High blood pressure. Diabetic.  CT HEAD WITHOUT CONTRAST  Technique:  Contiguous axial images were obtained from the base of the skull through the vertex without contrast.  Comparison: 04/14/2011.  Findings: No intracranial hemorrhage.  Global atrophy without hydrocephalus.  Small vessel disease type changes. No CT evidence of large acute infarct.  Small acute infarct cannot be excluded by CT.  No intracranial mass lesion detected on this unenhanced exam.  Vascular calcifications.  Minimal polypoid opacification anterior left maxillary sinus.  IMPRESSION: No intracranial hemorrhage or CT evidence of large acute infarct.  Original Report Authenticated By: Fuller Canada, M.D.     No diagnosis found.    MDM  Patient exhibiting no signs of hypertensive encephalopathy, stroke or infection. Strongly doubt acute narrow-angle glaucoma. Corneas appear normal pain is bilateral. No complaint of eye pain. Doubt temporal arteritis with normal sed rate and atypical symptoms Pain is not well controlled with hydrocodone Plan prescription Percocet Discontinue Vicodin Blood pressure recheck 1 week Diagnosis #1 headache #2 hypertension #3 renal insufficiency Blood pressure recheck within one week       I personally performed the services described in this documentation, which was scribed in my presence. The recorded information has been reviewed and considered.  Doug Sou, MD 07/28/11 2126

## 2011-07-30 ENCOUNTER — Ambulatory Visit: Payer: Medicare PPO | Admitting: Internal Medicine

## 2011-08-16 ENCOUNTER — Encounter: Payer: Self-pay | Admitting: Internal Medicine

## 2011-08-16 ENCOUNTER — Ambulatory Visit (INDEPENDENT_AMBULATORY_CARE_PROVIDER_SITE_OTHER): Payer: Medicare PPO | Admitting: Internal Medicine

## 2011-08-16 VITALS — BP 146/62 | HR 65 | Resp 18 | Ht 72.0 in | Wt 238.8 lb

## 2011-08-16 DIAGNOSIS — I495 Sick sinus syndrome: Secondary | ICD-10-CM

## 2011-08-16 DIAGNOSIS — I4891 Unspecified atrial fibrillation: Secondary | ICD-10-CM

## 2011-08-16 LAB — PACEMAKER DEVICE OBSERVATION
AL IMPEDENCE PM: 556 Ohm
ATRIAL PACING PM: 98
BATTERY VOLTAGE: 2.81 V
VENTRICULAR PACING PM: 0

## 2011-08-16 NOTE — Assessment & Plan Note (Signed)
Doing well s/p PPM The site is well healed. Recent cxr and echo were without concern   No changes today Regular activity is encouraged

## 2011-08-16 NOTE — Patient Instructions (Signed)
Your physician recommends that you schedule a follow-up appointment in 3 months with Dr Allred    

## 2011-08-16 NOTE — Progress Notes (Signed)
PCP:  Nehemiah Massed., MD, MD Primary Cardiologist:  Dr Eden Emms  The patient presents today for post op followup.  He reports that his left shoulder pain is improving.  Presently, he reports "soreness" after bowling.  He also has rare L axillary pain.  Unfortunately, she continues to focus on now having a pacemaker and seems anxious with this.  Denies chest pain or SOB.    Past Medical History  Diagnosis Date  . HTN (hypertension)   . Atrial fibrillation     persistent  . Cerebrovascular accident     10 years ago  . Obesity   . Diabetes mellitus   . Somnolence   . Hypercholesteremia   . Insomnia   . Pleural effusion   . Sleep apnea   . Deviated nasal septum   . Atrial enlargement, left   . Tachycardia-bradycardia     s/p PPM by Dr Johney Frame   Past Surgical History  Procedure Date  . 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 Lung procedure   . Pacemaker insertion 06/22/11    MDT PPM by Dr Johney Frame for tachy/brady syndrome    Current Outpatient Prescriptions  Medication Sig Dispense Refill  . Ascorbic Acid (VITAMIN C) 500 MG tablet Take 500 mg by mouth daily.       . Astaxanthin 4 MG CAPS Take 6 mg by mouth daily.       . B Complex Vitamins (VITAMIN B COMPLEX PO) Take 1 tablet by mouth daily.       . Cholecalciferol (VITAMIN D PO) Take 1 tablet by mouth daily.      Marland Kitchen CINNAMON PO Take 1 tablet by mouth daily.       . Coenzyme Q10 (CO Q 10 PO) Take 1 tablet by mouth daily.        . furosemide (LASIX) 40 MG tablet Take 40 mg by mouth daily.        . insulin glargine (LANTUS SOLOSTAR) 100 UNIT/ML injection Inject 20 Units into the skin at bedtime.       Marland Kitchen KRILL OIL PO Take 1 tablet by mouth daily.       Marland Kitchen latanoprost (XALATAN) 0.005 % ophthalmic solution Place 1 drop into both eyes at bedtime.        Marland Kitchen loratadine (CLARITIN) 10 MG tablet Take 10 mg by mouth daily.      Marland Kitchen losartan (COZAAR) 50 MG tablet Take 1 tablet (50 mg total) by mouth 2 (two) times daily.  180 tablet  3  .  MAGNESIUM CARBONATE PO Take 2 tablets by mouth daily.       . timolol (TIMOPTIC) 0.5 % ophthalmic solution Place 1 drop into the right eye 2 (two) times daily.      Marland Kitchen warfarin (COUMADIN) 10 MG tablet Take 10 mg by mouth 3 (three) times a week. As diretced      . warfarin (COUMADIN) 4 MG tablet Take 8 mg by mouth daily.       Marland Kitchen zolpidem (AMBIEN) 10 MG tablet Take 10 mg by mouth at bedtime as needed. For sleep        Allergies  Allergen Reactions  . Benicar (Olmesartan Medoxomil)     Flushing and Headache   . Amlodipine Anxiety    History   Social History  . Marital Status: Married    Spouse Name: N/A    Number of Children: N/A  . Years of Education: N/A   Occupational History  . Not  on file.   Social History Main Topics  . Smoking status: Former Smoker    Quit date: 06/12/1999  . Smokeless tobacco: Never Used  . Alcohol Use: Yes     rare  . Drug Use: No  . Sexually Active: Not on file   Other Topics Concern  . Not on file   Social History Narrative   RetiredRaising his 2 grandchildren as his son is an Immunologist and his wife commited suicideLikes to golf and watch [place] sports    Family History  Problem Relation Age of Onset  . Coronary artery disease     Physical Exam: Filed Vitals:   08/16/11 0910  BP: 146/62  Pulse: 65  Resp: 18  Height: 6' (1.829 m)  Weight: 238 lb 12.8 oz (108.319 kg)    GEN- The patient is well appearing, alert and oriented x 3 today.   Head- normocephalic, atraumatic Eyes-  Sclera clear, conjunctiva pink Ears- hearing intact Oropharynx- clear Lungs- Clear to ausculation bilaterally, normal work of breathing Chest- pacemaker pocket is well healed without any inflammation or local tenderness MS- full ROM in L shoulder Heart- Regular rate and rhythm, no murmurs, rubs or gallops, PMI not laterally displaced GI- soft, NT, ND, + BS Extremities- no clubbing, cyanosis, or edema  Recent CXR reveals stable lead and pacemaker  position ekg today reveals A pacing, baseline artifact, otherwise normal ekg  Pacemaker interrogation- reviewed in detail today,  See PACEART report  Assessment and Plan:

## 2011-08-30 ENCOUNTER — Telehealth: Payer: Self-pay | Admitting: Internal Medicine

## 2011-08-30 NOTE — Telephone Encounter (Signed)
New msg Pt wants to talk to you about his New BP med he is taking

## 2011-08-30 NOTE — Telephone Encounter (Signed)
BP pill added Carvedilol 3.125mg  bid   Went to see PCP and c/o HA  He thought it might be his BP so he started this if this doesn't help in two weeks he will see him back and start allergy medications  He only has pain at night when he is sitting watching TV does not hurt when he lays down  Discussed with Dr Johney Frame  He does not feel like this pain is related to his device  He feels like it is all musculoskeletal  I have relayed the message to the patient

## 2011-09-17 ENCOUNTER — Telehealth: Payer: Self-pay | Admitting: Internal Medicine

## 2011-09-17 NOTE — Telephone Encounter (Signed)
Emailed ROI again, did not go through the 1st time, Pt Received Copied Records he will pick Up 09/18/11/KM

## 2011-09-17 NOTE — Telephone Encounter (Signed)
Emailed a release form for records to [employer]. 4/8 emg

## 2011-09-18 ENCOUNTER — Telehealth: Payer: Self-pay | Admitting: *Deleted

## 2011-09-18 NOTE — Telephone Encounter (Signed)
SPOKE WITH PT PER DR NISHAN  PT DUE FOR F/U THYROID AND LFT'S  PT LEAVING FOR [place] [facility] WILL NEED TO CALL PT   BACK  IN 1-2 WEEKS TO SCHEDULE  LABS AT THAT TIME .Zack Seal

## 2011-09-18 NOTE — Telephone Encounter (Signed)
Pt Pick Up Records today 09/18/11/KM

## 2011-09-26 ENCOUNTER — Encounter: Payer: Medicare PPO | Admitting: Internal Medicine

## 2011-10-04 NOTE — Telephone Encounter (Signed)
PT NO LONGER  ON   AMIODARONE SEE PHONE NOTE FROM  07-13-11

## 2011-10-08 ENCOUNTER — Encounter: Payer: Self-pay | Admitting: *Deleted

## 2011-10-08 ENCOUNTER — Telehealth: Payer: Self-pay | Admitting: Internal Medicine

## 2011-11-14 ENCOUNTER — Other Ambulatory Visit: Payer: Self-pay | Admitting: *Deleted

## 2011-11-14 DIAGNOSIS — I6529 Occlusion and stenosis of unspecified carotid artery: Secondary | ICD-10-CM

## 2011-11-15 ENCOUNTER — Encounter: Payer: Self-pay | Admitting: Internal Medicine

## 2011-11-15 ENCOUNTER — Ambulatory Visit (INDEPENDENT_AMBULATORY_CARE_PROVIDER_SITE_OTHER): Payer: Medicare PPO | Admitting: Internal Medicine

## 2011-11-15 VITALS — BP 140/77 | HR 80 | Ht 72.0 in | Wt 231.0 lb

## 2011-11-15 DIAGNOSIS — I495 Sick sinus syndrome: Secondary | ICD-10-CM

## 2011-11-15 DIAGNOSIS — I4891 Unspecified atrial fibrillation: Secondary | ICD-10-CM

## 2011-11-15 LAB — PACEMAKER DEVICE OBSERVATION
AL AMPLITUDE: 4 mv
AL THRESHOLD: 0.5 V
BAMS-0001: 150 {beats}/min
BATTERY VOLTAGE: 2.81 V
RV LEAD AMPLITUDE: 11.2 mv
RV LEAD THRESHOLD: 1.25 V

## 2011-11-15 NOTE — Assessment & Plan Note (Signed)
Maintaining sinus rhythm without any afib since last visit No changes today Importance of compliance with INR checks (pt prefers PCP to check) was stressed today.   Return in see me 1/14 for pacemaker check.  He will follow-up with Dr Eden Emms as scheduled.

## 2011-11-15 NOTE — Patient Instructions (Signed)
Your physician wants you to follow-up in: 6 months with Dr. Allred. You will receive a reminder letter in the mail two months in advance. If you don't receive a letter, please call our office to schedule the follow-up appointment.  

## 2011-11-15 NOTE — Assessment & Plan Note (Signed)
Normal pacemaker function See Pace Art report No changes today  

## 2011-11-15 NOTE — Progress Notes (Signed)
PCP:  Nehemiah Massed., MD, MD Primary Cardiologist:  Dr Eden Emms  The patient presents today for routine followup.  He is doing very well.  [place].  He was placed on klonipin with improvements in headaches.  He had a nuclear stress test which he says was normal.  His pacemaker site is well healed and he is adjusting better to having a PPM.   Denies chest pain or SOB.    Past Medical History  Diagnosis Date  . HTN (hypertension)   . Atrial fibrillation     persistent  . Cerebrovascular accident     10 years ago  . Obesity   . Diabetes mellitus   . Somnolence   . Hypercholesteremia   . Insomnia   . Pleural effusion   . Sleep apnea   . Deviated nasal septum   . Atrial enlargement, left   . Tachycardia-bradycardia     s/p PPM by Dr Johney Frame   Past Surgical History  Procedure Date  . 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 Lung procedure   . Pacemaker insertion 06/22/11    MDT PPM by Dr Johney Frame for tachy/brady syndrome    Current Outpatient Prescriptions  Medication Sig Dispense Refill  . Ascorbic Acid (VITAMIN C) 500 MG tablet Take 500 mg by mouth daily.       . Astaxanthin 4 MG CAPS Take 6 mg by mouth daily.       . Cholecalciferol (VITAMIN D PO) Take 1 tablet by mouth daily.      Marland Kitchen CINNAMON PO Take 1 tablet by mouth daily.       . clonazePAM (KLONOPIN) 1 MG tablet Take 1 mg by mouth at bedtime.      . Coenzyme Q10 (CO Q 10 PO) Take 1 tablet by mouth daily.        . furosemide (LASIX) 40 MG tablet Take 40 mg by mouth daily.        . insulin glargine (LANTUS SOLOSTAR) 100 UNIT/ML injection Inject 15 Units into the skin at bedtime.       Marland Kitchen KRILL OIL PO Take 1 tablet by mouth daily.       Marland Kitchen L-Methylfolate 7.5 MG TABS Take 1 tablet by mouth daily.      Marland Kitchen latanoprost (XALATAN) 0.005 % ophthalmic solution Place 1 drop into both eyes at bedtime.        Marland Kitchen losartan (COZAAR) 50 MG tablet Take 1 tablet (50 mg total)  by mouth 2 (two) times daily.  180 tablet  3  . MAGNESIUM CARBONATE PO Take 2 tablets by mouth daily.       . timolol (TIMOPTIC) 0.5 % ophthalmic solution Place 1 drop into the right eye 2 (two) times daily.      Marland Kitchen warfarin (COUMADIN) 10 MG tablet Take 10 mg by mouth 3 (three) times a week. As diretced      . warfarin (COUMADIN) 4 MG tablet Take 8 mg by mouth daily.         Allergies  Allergen Reactions  . Benicar (Olmesartan Medoxomil)     Flushing and Headache   . Amlodipine Anxiety    History   Social History  . Marital Status: Married    Spouse Name: N/A    Number of Children: N/A  . Years of Education: N/A   Occupational History  . Not on file.   Social History Main Topics  .  Smoking status: Former Smoker    Quit date: 06/12/1999  . Smokeless tobacco: Never Used  . Alcohol Use: Yes     rare  . Drug Use: No  . Sexually Active: Not on file   Other Topics Concern  . Not on file   Social History Narrative   RetiredRaising his 2 grandchildren as his son is an Immunologist and his wife commited suicideLikes to golf and watch [place] sports    Family History  Problem Relation Age of Onset  . Coronary artery disease     Physical Exam: Filed Vitals:   11/15/11 1011  BP: 140/77  Pulse: 80  Height: 6' (1.829 m)  Weight: 231 lb (104.781 kg)    GEN- The patient is well appearing, alert and oriented x 3 today.   Head- normocephalic, atraumatic Eyes-  Sclera clear, conjunctiva pink Ears- hearing intact Oropharynx- clear Lungs- Clear to ausculation bilaterally, normal work of breathing Chest- pacemaker pocket is well healed without any inflammation or local tenderness Heart- Regular rate and rhythm, no murmurs, rubs or gallops, PMI not laterally displaced GI- soft, NT, ND, + BS Extremities- no clubbing, cyanosis, or edema  Pacemaker interrogation- reviewed in detail today,  See PACEART report  Assessment and Plan:

## 2011-11-16 ENCOUNTER — Encounter (INDEPENDENT_AMBULATORY_CARE_PROVIDER_SITE_OTHER): Payer: Medicare PPO

## 2011-11-16 DIAGNOSIS — I6529 Occlusion and stenosis of unspecified carotid artery: Secondary | ICD-10-CM

## 2012-04-24 ENCOUNTER — Ambulatory Visit (INDEPENDENT_AMBULATORY_CARE_PROVIDER_SITE_OTHER): Payer: Medicare PPO | Admitting: Cardiovascular Disease

## 2012-04-24 ENCOUNTER — Encounter: Payer: Self-pay | Admitting: Cardiovascular Disease

## 2012-04-24 VITALS — BP 172/77 | HR 72 | Ht 72.0 in | Wt 246.0 lb

## 2012-04-24 DIAGNOSIS — I6322 Cerebral infarction due to unspecified occlusion or stenosis of basilar arteries: Secondary | ICD-10-CM

## 2012-04-24 DIAGNOSIS — E7800 Pure hypercholesterolemia, unspecified: Secondary | ICD-10-CM

## 2012-04-24 DIAGNOSIS — I10 Essential (primary) hypertension: Secondary | ICD-10-CM

## 2012-04-24 NOTE — Assessment & Plan Note (Signed)
F/U pacer clinic in January normal function

## 2012-04-24 NOTE — Assessment & Plan Note (Signed)
Cholesterol is at goal.  Continue current dose of statin and diet Rx.  No myalgias or side effects.  F/U  LFT's in 6 months. Lab Results  Component Value Date   LDLCALC  Value: 94        Total Cholesterol/HDL:CHD Risk Coronary Heart Disease Risk Table                     Men   Women  1/2 Average Risk   3.4   3.3  Average Risk       5.0   4.4  2 X Average Risk   9.6   7.1  3 X Average Risk  23.4   11.0        Use the calculated Patient Ratio above and the CHD Risk Table to determine the patient's CHD Risk.        ATP III CLASSIFICATION (LDL):  <100     mg/dL   Optimal  100-129  mg/dL   Near or Above                    Optimal  130-159  mg/dL   Borderline  160-189  mg/dL   High  >190     mg/dL   Very High 04/20/2010             

## 2012-04-24 NOTE — Assessment & Plan Note (Signed)
Pacer interogation with no afib  Less palpitations on coreg  Coumadin followed by primary

## 2012-04-24 NOTE — Assessment & Plan Note (Signed)
Well controlled.  Continue current medications and low sodium Dash type diet.    

## 2012-04-24 NOTE — Progress Notes (Signed)
Patient ID: Kenneth Jennings, male   DOB: 1940/12/09, 71 y.o.   MRN: [id number] Kenneth Jennings is a 71 yo male with AFib/Flutter, HTN, DM2, HLP and carotid stenosis. He had a left VATS for chronic left pleural effusion in Dec 2011. He has significant LAE and is not a candidate for AFib ablation. He is on coumadin. He called in recently with concerns about his BP. He was apparently put on amlodipine. He had his losartan increased, but after calling in with further concerns, this was changed to Benicar on 08/09/10.    He seems to think that the Benicar is making his blood pressure go up. Reviewed two recent ER visits for elevated BP. Agree with stopping Benicar He denies chest pain, shortness of breath, syncope. He does have some headaches, although he feels that this could be from sinus congestion. He feels flushed after taking the Benicar. He denies any rash or difficulty with breathing. He Is still taking his Lasix   Also has had left VATS with [facility]. CXR With ? Lung nodule but F/U CT in [place] no lesion. 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 Will do DLCO in 3 months.  Has insomnia ? Made worse by amiodarone   Subsequent pacer by Dr Johney Frame  1/13 [employer]  Last visit interogation normal with no PAF  Carotid 6/13  60-79% LICA needs f/u in December  ROS: Denies fever, malais, weight loss, blurry vision, decreased visual acuity, cough, sputum, SOB, hemoptysis, pleuritic pain, palpitaitons, heartburn, abdominal pain, melena, lower extremity edema, claudication, or rash.  All other systems reviewed and negative  General: Affect appropriate Healthy:  appears stated age HEENT: normal Neck supple with no adenopathy JVP normal no bruits no thyromegaly Lungs clear with no wheezing and good diaphragmatic motion Heart:  S1/S2 no murmur, no rub, gallop or click PMI normal Abdomen: benighn, BS positve, no tenderness, no AAA no bruit.  No HSM or HJR Distal pulses intact with no bruits No  edema Neuro non-focal Skin warm and dry No muscular weakness   Current Outpatient Prescriptions  Medication Sig Dispense Refill  . Ascorbic Acid (VITAMIN C) 500 MG tablet Take 500 mg by mouth daily.       . carvedilol (COREG) 6.25 MG tablet Take 6.25 mg by mouth 2 (two) times daily with a meal.      . Cholecalciferol (VITAMIN D PO) Take 1 tablet by mouth daily.      Marland Kitchen CINNAMON PO Take 1 tablet by mouth daily.       . clonazePAM (KLONOPIN) 1 MG tablet Take 0.25 mg by mouth at bedtime.       . Coenzyme Q10 (CO Q 10 PO) Take 1 tablet by mouth daily.        . furosemide (LASIX) 40 MG tablet Take 40 mg by mouth daily.        . insulin glargine (LANTUS SOLOSTAR) 100 UNIT/ML injection Inject 15 Units into the skin at bedtime.       Marland Kitchen KRILL OIL PO Take 1 tablet by mouth daily.       Marland Kitchen latanoprost (XALATAN) 0.005 % ophthalmic solution Place 1 drop into both eyes at bedtime.        Marland Kitchen losartan (COZAAR) 50 MG tablet Take 1 tablet (50 mg total) by mouth 2 (two) times daily.  180 tablet  3  . MAGNESIUM CARBONATE PO Take 2 tablets by mouth daily.       . timolol (TIMOPTIC) 0.5 % ophthalmic  solution Place 1 drop into the right eye 2 (two) times daily.      Marland Kitchen warfarin (COUMADIN) 10 MG tablet Take 10 mg by mouth 3 (three) times a week. As diretced      . [DISCONTINUED] warfarin (COUMADIN) 4 MG tablet Take 8 mg by mouth daily.         Allergies  Benicar and Amlodipine  Electrocardiogram:  Assessment and Plan

## 2012-04-24 NOTE — Patient Instructions (Addendum)
Your physician wants you to follow-up in:  6 months. You will receive a reminder letter in the mail two months in advance. If you don't receive a letter, please call our office to schedule the follow-up appointment.  Your physician has requested that you have a carotid duplex. To be done in December 2013 This test is an ultrasound of the carotid arteries in your neck. It looks at blood flow through these arteries that supply the brain with blood. Allow one hour for this exam. There are no restrictions or special instructions.

## 2012-04-24 NOTE — Assessment & Plan Note (Signed)
60% :LICA  F/U duplex in December ASA

## 2012-05-23 ENCOUNTER — Encounter (INDEPENDENT_AMBULATORY_CARE_PROVIDER_SITE_OTHER): Payer: Medicare PPO

## 2012-05-23 DIAGNOSIS — I6322 Cerebral infarction due to unspecified occlusion or stenosis of basilar arteries: Secondary | ICD-10-CM

## 2012-05-23 DIAGNOSIS — I6529 Occlusion and stenosis of unspecified carotid artery: Secondary | ICD-10-CM

## 2012-06-06 ENCOUNTER — Telehealth: Payer: Self-pay | Admitting: Internal Medicine

## 2012-06-06 NOTE — Telephone Encounter (Signed)
Spoke with patient  He has been eating lots of salt over the holidays  His BP is 154/98 with a HR of 110.  Discussed with Dr Johney Frame advised patient to take his pm dose of Carvedilol now and watch the salt and sweets (he ate entire pumpkin pie).  He is going to do so over the weekend and give [place] a call back on Mon.  He says this all just started after eating poorly the last few days

## 2012-06-06 NOTE — Telephone Encounter (Signed)
HR is 114 and b/p is 154/98 and he wants no other symptoms but he feels bad and he wants to know what to do

## 2012-06-09 ENCOUNTER — Encounter: Payer: Self-pay | Admitting: Physician Assistant

## 2012-06-09 ENCOUNTER — Ambulatory Visit (INDEPENDENT_AMBULATORY_CARE_PROVIDER_SITE_OTHER): Payer: Medicare PPO | Admitting: *Deleted

## 2012-06-09 ENCOUNTER — Encounter: Payer: Self-pay | Admitting: Internal Medicine

## 2012-06-09 ENCOUNTER — Ambulatory Visit (INDEPENDENT_AMBULATORY_CARE_PROVIDER_SITE_OTHER): Payer: Medicare PPO | Admitting: Physician Assistant

## 2012-06-09 VITALS — BP 130/68 | HR 88 | Ht 72.0 in | Wt 253.6 lb

## 2012-06-09 DIAGNOSIS — I4891 Unspecified atrial fibrillation: Secondary | ICD-10-CM

## 2012-06-09 DIAGNOSIS — I10 Essential (primary) hypertension: Secondary | ICD-10-CM

## 2012-06-09 DIAGNOSIS — I495 Sick sinus syndrome: Secondary | ICD-10-CM

## 2012-06-09 DIAGNOSIS — N189 Chronic kidney disease, unspecified: Secondary | ICD-10-CM | POA: Insufficient documentation

## 2012-06-09 LAB — BASIC METABOLIC PANEL
CO2: 30 mEq/L (ref 19–32)
Chloride: 100 mEq/L (ref 96–112)
Creatinine, Ser: 1.8 mg/dL — ABNORMAL HIGH (ref 0.4–1.5)
Potassium: 4.4 mEq/L (ref 3.5–5.1)
Sodium: 139 mEq/L (ref 135–145)

## 2012-06-09 MED ORDER — CARVEDILOL 6.25 MG PO TABS: 9.7500 mg | ORAL_TABLET | Freq: Two times a day (BID) | ORAL | Status: DC

## 2012-06-09 NOTE — Progress Notes (Signed)
[address], [address] [place], [place]  [address] Phone: [phone], Fax:  [phone]  Date:  06/09/2012   Name:  Kenneth Jennings   DOB:  1941/03/03   MRN:  [id number]  PCP:  Nehemiah Massed., MD  Primary Cardiologist:  Dr. Charlton Haws  Primary Electrophysiologist:  Dr. [name]    History of Present Illness: Kenneth Jennings is a 71 y.o. male who returns for evaluation of AFib.  He has a history of atrial fibrillation/flutter, HTN, DM2, HL, CKD, carotid stenosis. Patient has undergone left VATS for chronic left pleural effusion 04/2010. Echo 5/11: Mild LVH, EF 65%, moderate to severe LAE, PASP 33. Nuclear study 6/11: No ischemia. Carotid Dopplers 6/13: RICA 40-59%; LICA 60-79%.  He has undergone multiple cardioversions in the past. 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 Johney Frame. He was previously on flecainide but failed this and was changed to amiodarone. He is no longer on amiodarone.  It is not clear to me why this was d/c'd.  He thinks he had side effects to the drug.  Last seen by Dr. Eden Emms 11/13. There was no evidence of atrial fibrillation on interrogation of his device at that time.  He states he feels like he went back into AFib last week.  He was eating more over the holidays and felt like he had too much salt in his diet.  He notes elevated HRs at times.  He has adjusted his diet back to normal and feels somewhat better.  He does feel fatigued.  He denies chest pain, dyspnea, orthopnea, PND, edema.  No syncope or near syncope.  He has felt dizzy at times.  Labs (11/12):  ALT 22, TSH 5.01 Labs (2/13):    K 3.5, creatinine 1.60, Hgb 14.1   Wt Readings from Last 3 Encounters:  06/09/12 253 lb 9.6 oz (115.032 kg)  04/24/12 246 lb (111.585 kg)  11/15/11 231 lb (104.781 kg)     Past Medical History  Diagnosis Date  . HTN (hypertension)   . Atrial fibrillation     persistent  . Cerebrovascular  accident     10 years ago  . Obesity   . Diabetes mellitus   . Somnolence   . Hypercholesteremia   . Insomnia   . Pleural effusion   . Sleep apnea   . Deviated nasal septum   . Atrial enlargement, left   . Tachycardia-bradycardia     s/p PPM by Dr Johney Frame    Past Surgical History  Procedure Date  . 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 Lung procedure   . Pacemaker insertion 06/22/11    MDT PPM by Dr Johney Frame for tachy/brady syndrome     Current Outpatient Prescriptions  Medication Sig Dispense Refill  . Ascorbic Acid (VITAMIN C) 500 MG tablet Take 500 mg by mouth daily.       . carvedilol (COREG) 6.25 MG tablet Take 6.25 mg by mouth 2 (two) times daily with a meal.      . Cholecalciferol (VITAMIN D PO) Take 1 tablet by mouth daily.      Marland Kitchen CINNAMON PO Take 1 tablet by mouth daily.       . Coenzyme Q10 (CO Q 10 PO) Take 1 tablet by mouth daily.        . folic acid (FOLVITE) 800 MCG tablet Take 400 mcg by mouth daily.      . furosemide (LASIX) 40 MG tablet  Take 40 mg by mouth daily.        . insulin glargine (LANTUS SOLOSTAR) 100 UNIT/ML injection Inject 15 Units into the skin at bedtime.       Marland Kitchen latanoprost (XALATAN) 0.005 % ophthalmic solution Place 1 drop into both eyes at bedtime.        Marland Kitchen losartan (COZAAR) 50 MG tablet Take 1 tablet (50 mg total) by mouth 2 (two) times daily.  180 tablet  3  . MAGNESIUM CARBONATE PO Take 2 tablets by mouth daily.       . Melatonin-Pyridoxine (MELATIN PO) Take 0.5 mg by mouth at bedtime as needed.      . timolol (TIMOPTIC) 0.5 % ophthalmic solution Place 1 drop into the right eye 2 (two) times daily.      . vitamin B-12 (CYANOCOBALAMIN) 50 MCG tablet Take 50 mcg by mouth daily.      Marland Kitchen warfarin (COUMADIN) 10 MG tablet Take 10 mg by mouth 3 (three) times a week. As diretced      . clonazePAM (KLONOPIN) 1 MG tablet Take 0.25 mg by mouth at bedtime.       Marland Kitchen KRILL OIL PO Take 1 tablet by mouth daily.         Allergies: Allergies    Allergen Reactions  . Benicar (Olmesartan Medoxomil)     Flushing and Headache   . Amlodipine Anxiety    Social History:  The patient  reports that he quit smoking about 13 years ago. He has never used smokeless tobacco. He reports that he drinks alcohol. He reports that he does not use illicit drugs.   ROS:  Please see the history of present illness.    All other systems reviewed and negative.   PHYSICAL EXAM: VS:  BP 130/68  Pulse 88  Ht 6' (1.829 m)  Wt 253 lb 9.6 oz (115.032 kg)  BMI 34.39 kg/m2 Well nourished, well developed, in no acute distress HEENT: normal Neck: no JVD Cardiac:  normal S1, S2; irregularly irregular rhythm; no murmur Lungs:  clear to auscultation bilaterally, no wheezing, rhonchi or rales Abd: soft, nontender, no hepatomegaly Ext: no edema Skin: warm and dry Neuro:  CNs 2-12 intact, no focal abnormalities noted  EKG:  Atrial fibrillation, HR 88, normal axis, nonspecific ST-T wave changes      ASSESSMENT AND PLAN:  1. Atrial Fibrillation:  We interrogated his device today.  He went out of rhythm 12/26.  HRs 80-100 40% of the time.  He does have some HRs 100-120 about 30% of the time.  I will adjust his coreg to 9.75 mg bid for rate control.  Coumadin managed by PCP.  Will contact PCP to get most recent INRs.  He has follow up with Dr. [name] 1/17 and will keep that appt.  If he continues to have symptoms, will likely need to consider anti-arrhythmic drug Rx again and DCCV.  Given his atrial enlargement, I suspect DCCV alone will not be successful.  I discussed this with the patient today.  We may simply be able to just try amiodarone again.  He is somewhat hesitant regarding this.  He notes cost as an issue.  We also discussed strategy of rate control only (AFFIRM).  Check BMET and TSH today.  Received records from his PCP:  INR (05/30/12):   2.3 INR (05/02/12):   2.8 INR (04/22/12):   3.1 INR (04/03/12:    2.7 INR (03/27/12):   1.8  2.  Hypertension:  Controlled.  3. Chronic Kidney Disease:  Creatinine clearance is calculated > 50.  He asked about Xarelto.  He could take 20 mg QD.  He is not sure if he can afford.  I asked him to look into this.    4. Disposition:  Follow up with Dr. [name] 06/27/12 as planned.  Signed, Tereso Newcomer, PA-C  2:15 PM 06/09/2012

## 2012-06-09 NOTE — Telephone Encounter (Signed)
Danton Clap 06/09/2012 9:17 AM Signed  Pt' heart still out of rhythm, pls call

## 2012-06-09 NOTE — Telephone Encounter (Signed)
This encounter was created in error - please disregard.

## 2012-06-09 NOTE — Telephone Encounter (Signed)
BP 116/75 but HR is 90 feels like he has skipped beats.  Feels better than he did on Friday but still feels he needs to be seen.  I have added him on at 2pm today to see Lilian Coma  Patient aware

## 2012-06-09 NOTE — Patient Instructions (Addendum)
Increase your Coreg to 9.75mg  Twice Daily (1.5 Tablets Twice Daily)  Today labs (BMET, TSH)  Keep Device Check appointment with Dr. Johney Frame 06/27/12

## 2012-06-09 NOTE — Telephone Encounter (Signed)
Pt' heart still out of rhythm, pls call

## 2012-06-10 ENCOUNTER — Telehealth: Payer: Self-pay | Admitting: *Deleted

## 2012-06-10 ENCOUNTER — Encounter: Payer: Self-pay | Admitting: Internal Medicine

## 2012-06-10 DIAGNOSIS — I4891 Unspecified atrial fibrillation: Secondary | ICD-10-CM

## 2012-06-10 LAB — PACEMAKER DEVICE OBSERVATION
ATRIAL PACING PM: 96.5
BRDY-0002RV: 60 {beats}/min
BRDY-0004RV: 120 {beats}/min

## 2012-06-10 NOTE — Telephone Encounter (Signed)
Message copied by Tarri Fuller on Tue Jun 10, 2012  8:45 AM ------      Message from: [place], [place] T      Created: Tue Jun 10, 2012  8:17 AM       BUN high       Creatinine stable      TSH low        -  Hold Lasix x 2 days.  May resume at 20 mg QD thereafter.  Check BMET in 1 week        -  Repeat TSH with Free T4 and Free T3.  He will need follow up with PCP for thyroid.  Fax labs to PCP.  If he wants to get repeat thyroid labs with PCP, that is ok.      Tereso Newcomer, PA-C  8:17 AM 06/10/2012

## 2012-06-10 NOTE — Progress Notes (Signed)
Pacer interrogation only   

## 2012-06-10 NOTE — Telephone Encounter (Signed)
pt notified about lab results and to hold lasix x 2 dys, then resume 20 mg daily, bmet 06/18/12

## 2012-06-10 NOTE — Telephone Encounter (Signed)
lmptcb to go over lab results and med changes with repeat labs

## 2012-06-18 ENCOUNTER — Telehealth: Payer: Self-pay | Admitting: *Deleted

## 2012-06-18 ENCOUNTER — Other Ambulatory Visit (INDEPENDENT_AMBULATORY_CARE_PROVIDER_SITE_OTHER): Payer: Medicare PPO

## 2012-06-18 DIAGNOSIS — I4891 Unspecified atrial fibrillation: Secondary | ICD-10-CM

## 2012-06-18 DIAGNOSIS — E0590 Thyrotoxicosis, unspecified without thyrotoxic crisis or storm: Secondary | ICD-10-CM

## 2012-06-18 LAB — BASIC METABOLIC PANEL
BUN: 31 mg/dL — ABNORMAL HIGH (ref 6–23)
GFR: 40.28 mL/min — ABNORMAL LOW (ref >60.00)
Potassium: 4.1 mEq/L (ref 3.5–5.1)
Sodium: 138 mEq/L (ref 135–145)

## 2012-06-18 LAB — T4, FREE: Free T4: 2.14 ng/dL — ABNORMAL HIGH (ref 0.60–1.60)

## 2012-06-18 LAB — T3, FREE: T3, Free: 3.5 pg/mL (ref 2.3–4.2)

## 2012-06-18 LAB — TSH: TSH: 0.09 u[IU]/mL — ABNORMAL LOW (ref 0.35–5.50)

## 2012-06-18 NOTE — Telephone Encounter (Signed)
pt notified about lab results and referral to Endocrinology for hyperthyroidism  and to continue coreg, pt verbalized understanding today,

## 2012-06-18 NOTE — Telephone Encounter (Signed)
Message copied by Tarri Fuller on Wed Jun 18, 2012  5:38 PM ------      Message from: [place], [place] T      Created: Wed Jun 18, 2012  5:06 PM       Creatinine stable.      TSH low and T4 high.      Make sure he does not take Synthroid.  If he does, let me know.      Otherwise, he needs referral to Endocrinology for hyperthyroidism.  Continue taking Carvedilol.      Tereso Newcomer, PA-C  5:06 PM 06/18/2012

## 2012-06-18 NOTE — Telephone Encounter (Signed)
ORDER PLACED FOR REFERRAL FOR ENDOCRINOLOGY DX HYPERTHYROIDISM; PT HAS APPT W/DR. ALLRED ON 1/17 10:30, SAID SEE IF APPT W/ENDCRINOLOGIST CAN BE SAME DAY SINCE PT LIVES IN [place], [place]

## 2012-06-19 ENCOUNTER — Encounter: Payer: Self-pay | Admitting: Internal Medicine

## 2012-06-27 ENCOUNTER — Encounter: Payer: Self-pay | Admitting: *Deleted

## 2012-06-27 ENCOUNTER — Encounter: Payer: Self-pay | Admitting: Internal Medicine

## 2012-06-27 ENCOUNTER — Ambulatory Visit (INDEPENDENT_AMBULATORY_CARE_PROVIDER_SITE_OTHER): Payer: Medicare PPO | Admitting: Internal Medicine

## 2012-06-27 ENCOUNTER — Other Ambulatory Visit: Payer: Self-pay | Admitting: *Deleted

## 2012-06-27 VITALS — BP 118/62 | HR 80 | Ht 73.0 in | Wt 254.8 lb

## 2012-06-27 VITALS — BP 122/62 | HR 100 | Temp 98.2°F | Resp 16 | Ht 73.0 in | Wt 256.0 lb

## 2012-06-27 DIAGNOSIS — Z7901 Long term (current) use of anticoagulants: Secondary | ICD-10-CM

## 2012-06-27 DIAGNOSIS — E0590 Thyrotoxicosis, unspecified without thyrotoxic crisis or storm: Secondary | ICD-10-CM | POA: Insufficient documentation

## 2012-06-27 DIAGNOSIS — I4891 Unspecified atrial fibrillation: Secondary | ICD-10-CM

## 2012-06-27 LAB — BASIC METABOLIC PANEL
BUN: 32 mg/dL — ABNORMAL HIGH (ref 6–23)
CO2: 27 mEq/L (ref 19–32)
Calcium: 8.8 mg/dL (ref 8.4–10.5)
Creatinine, Ser: 1.7 mg/dL — ABNORMAL HIGH (ref 0.4–1.5)
GFR: 42.48 mL/min — ABNORMAL LOW (ref >60.00)
GFR: 41.63 mL/min — ABNORMAL LOW (ref >60.00)
Glucose, Bld: 260 mg/dL — ABNORMAL HIGH (ref 70–99)
Potassium: 4 mEq/L (ref 3.5–5.1)
Sodium: 137 mEq/L (ref 135–145)

## 2012-06-27 LAB — PROTIME-INR
INR: 2.6 ratio — ABNORMAL HIGH (ref 0.8–1.0)
INR: 2.6 ratio — ABNORMAL HIGH (ref 0.8–1.0)
Prothrombin Time: 26.5 s — ABNORMAL HIGH (ref 10.2–12.4)

## 2012-06-27 LAB — CBC
Hemoglobin: 12.8 g/dL — ABNORMAL LOW (ref 13.0–17.0)
MCHC: 33.5 g/dL (ref 30.0–36.0)
MCV: 94.7 fl (ref 78.0–100.0)
RDW: 12.9 % (ref 11.5–14.6)

## 2012-06-27 LAB — PACEMAKER DEVICE OBSERVATION
AL AMPLITUDE: 2 mv
BAMS-0001: 150 {beats}/min
BATTERY VOLTAGE: 2.81 V
BRDY-0002RV: 60 {beats}/min
BRDY-0004RV: 120 {beats}/min
VENTRICULAR PACING PM: 1

## 2012-06-27 LAB — MAGNESIUM: Magnesium: 2.4 mg/dL (ref 1.5–2.5)

## 2012-06-27 LAB — TSH: TSH: 0.1 u[IU]/mL — ABNORMAL LOW (ref 0.35–5.50)

## 2012-06-27 LAB — HEPATIC FUNCTION PANEL
Albumin: 4.1 g/dL (ref 3.5–5.2)
Alkaline Phosphatase: 58 U/L (ref 39–117)

## 2012-06-27 MED ORDER — CARVEDILOL 12.5 MG PO TABS: 12.5000 mg | ORAL_TABLET | Freq: Two times a day (BID) | ORAL | Status: DC

## 2012-06-27 MED ORDER — METHIMAZOLE 5 MG PO TABS: 5.0000 mg | ORAL_TABLET | Freq: Two times a day (BID) | ORAL | Status: DC

## 2012-06-27 NOTE — Patient Instructions (Addendum)
Your physician recommends that you schedule a follow-up appointment in: 4 weeks with Kenneth Jennings  Your physician has recommended you make the following change in your medication:  1) Increase Carvedilol to 12.5mg  twice daily  Your physician wants you to follow-up in: 12 months with Dr Jacquiline Doe will receive a reminder letter in the mail two months in advance. If you don't receive a letter, please call our office to schedule the follow-up appointment.   Remote monitoring is used to monitor your Pacemaker of ICD from home. This monitoring reduces the number of office visits required to check your device to one time per year. It allows [place] to keep an eye on the functioning of your device to ensure it is working properly. You are scheduled for a device check from home on 09/29/12. You may send your transmission at any time that day. If you have a wireless device, the transmission will be sent automatically. After your physician reviews your transmission, you will receive a postcard with your next transmission date.  Your physician has recommended that you have a Cardioversion (DCCV). Electrical Cardioversion uses a jolt of electricity to your heart either through paddles or wired patches attached to your chest. This is a controlled, usually prescheduled, procedure. Defibrillation is done under light anesthesia in the hospital, and you usually go home the day of the procedure. This is done to get your heart back into a normal rhythm. You are not awake for the procedure. Please see the instruction sheet given to you today.  07/02/12

## 2012-06-27 NOTE — Progress Notes (Signed)
Subjective:     Patient ID: Kenneth Jennings, male   DOB: 01/06/1941, 72 y.o.   MRN: [id number]  HPI Kenneth Jennings is a pleasant 72 y/o man referred by Kenneth. Eden Jennings for evaluation and treatment of hyperthyroidism.   The patient had thyroid function tests performed on 06/18/2012 and a TSH was 0.09. Free T4 was elevated at 2.14 (0.6-1.6) and a free T3 was 3.5 (2.3-4.2). His kidney function was stable with a GFR of 40. On 06/09/2012, he is TSH was 0.05, however, previous values reviewed where the upper limits of normal (ranging from 4-5.2). He was told he had abnormal thyroid fxn in the past, cannot remember when, but it was before he was started on Amiodarone, he thinks (came off the Amiodarone 1 year ago, after he had his pacemaker inserted). He was not treated for the thyroid condition. No h/o Graves ds. apparently. No recent contrast imaging studies. No increased dietary iodine that he can tell, no herbal meds (kelp). A CT of his neck (04/12/2010) showed a normal size thyroid, without nodules.  He is in Afib since Christmas. Pulse 90-100, despite Coreg increase at last visit with Kenneth Jennings at the end of last month. Sees Cardiology today after this appt.I reviewed his chart including office notes, telephone notes, labs, and available scanned records. Last Echo 10/2009: EF 65%, mild LVH. Last myoview 11/2009: no ischemia. Has pacemaker for tachy-brady sd. since 06/2011. Per last cardiology note, he might need to restart amiodarone in the future. He has had multiple cardioversions in the past. He is on Coumadin.  Daughter has thyroid ds but he did not see her in a long time (lives in [place]) and does not know what kind of dysfunction. No FH ThyCa. No FH of AI ds.   No spx related to hyperthyroidism now. No anxiety, depression. No tremors or jitteriness. Please also see ROS section. Last year he lost 35 lbs, gained 30 lbs back.   The patient doesn't have a history of diabetes type 2 and he is on Lantus  and Cinnamon.  Past Medical History  Diagnosis Date  . HTN (hypertension)   . Atrial fibrillation     persistent  . Cerebrovascular accident     10 years ago  . Obesity   . Diabetes mellitus   . Somnolence   . Hypercholesteremia   . Insomnia   . Pleural effusion   . Sleep apnea   . Deviated nasal septum   . Atrial enlargement, left   . Tachycardia-bradycardia     s/p PPM by Kenneth Jennings  . Thyroid disease    Past Surgical History  Procedure Date  . 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 Lung procedure   . Pacemaker insertion 06/22/11    MDT PPM by Kenneth Jennings for tachy/brady syndrome   History   Social History  . Marital Status: Married    Spouse Name: N/A    Number of Children: N/A  . Years of Education: N/A   Occupational History  . Not on file.   Social History Main Topics  . Smoking status: Former Smoker    Quit date: 06/12/1999  . Smokeless tobacco: Never Used  . Alcohol Use: Yes     Comment: rare  . Drug Use: No  . Sexually Active: No   Other Topics Concern  . Not on file   Social History Narrative   RetiredRaising his 2 grandchildren as his son is an Immunologist and his wife commited  suicideLikes to golf and watch [place] sports   Current Outpatient Prescriptions on File Prior to Visit  Medication Sig Dispense Refill  . Ascorbic Acid (VITAMIN C) 500 MG tablet Take 500 mg by mouth daily.       . Cholecalciferol (VITAMIN D PO) Take 1 tablet by mouth daily.      . Coenzyme Q10 (CO Q 10 PO) Take 1 tablet by mouth daily.        . folic acid (FOLVITE) 800 MCG tablet Take 400 mcg by mouth daily.      . furosemide (LASIX) 40 MG tablet Take 40 mg by mouth daily.        . insulin glargine (LANTUS SOLOSTAR) 100 UNIT/ML injection Inject 15 Units into the skin at bedtime.       Marland Kitchen latanoprost (XALATAN) 0.005 % ophthalmic solution Place 1 drop into both eyes at bedtime.        Marland Kitchen losartan (COZAAR) 50 MG tablet Take 1 tablet (50 mg total) by mouth 2 (two)  times daily.  180 tablet  3  . MAGNESIUM CARBONATE PO Take 2 tablets by mouth daily.       . Melatonin-Pyridoxine (MELATIN PO) Take 0.5 mg by mouth at bedtime as needed.      . timolol (TIMOPTIC) 0.5 % ophthalmic solution Place 1 drop into the right eye 2 (two) times daily.      . vitamin B-12 (CYANOCOBALAMIN) 50 MCG tablet Take 50 mcg by mouth daily.      . carvedilol (COREG) 12.5 MG tablet Take 1 tablet (12.5 mg total) by mouth 2 (two) times daily with a meal.  60 tablet  3  . methimazole (TAPAZOLE) 5 MG tablet Take 1 tablet (5 mg total) by mouth 2 (two) times daily.  60 tablet  2  . warfarin (COUMADIN) 10 MG tablet Take 10 mg by mouth 3 (three) times a week. As diretced       Allergies  Allergen Reactions  . Benicar (Olmesartan Medoxomil)     Flushing and Headache   . Amlodipine Anxiety   Family History  Problem Relation Age of Onset  . Coronary artery disease    . Cancer Mother    Review of Systems Constitutional: + weight gain/loss (see HPI), no fatigue, + subjective hypothermia, insomnia Eyes: no blurry vision, no xerophthalmia ENT: no sore throat, no nodules palpated in throat, no dysphagia/odynophagia, no hoarseness Cardiovascular: no CP/SOB/+ palpitations/leg swelling Respiratory: no cough/SOB Gastrointestinal: no N/V/D/C Musculoskeletal: + muscle/ no joint aches Skin: no rashes, + hair loss Neurological: no tremors/numbness/tingling/dizziness, + HA, + tinnitus, + decreased hearing Psychiatric: + depression/anxiety Low libido, difficulty with erections   Objective:   Physical Exam BP 122/62  Pulse 100  Temp 98.2 F (36.8 C) (Oral)  Resp 16  Ht 6\' 1"  (1.854 m)  Wt 256 lb (116.121 kg)  BMI 33.78 kg/m2  SpO2 96% Wt Readings from Last 3 Encounters:  06/27/12 256 lb (116.121 kg)  06/09/12 253 lb 9.6 oz (115.032 kg)  04/24/12 246 lb (111.585 kg)   Constitutional: overweight (central obesity)  in NAD Eyes: PERRLA, EOMI, no exophthalmos, no lid lag, no stare ENT:  moist mucous membranes, slight symmetric thyromegaly, no cervical lymphadenopathy, no thyroid bruits Cardiovascular: irreg. irreg. rhythm, No MRG Respiratory: CTA B Gastrointestinal: abdomen soft, NT, ND, BS+ Musculoskeletal: no deformities, strength intact in all 4 Skin: moist, warm, no rashes Neurological: + moderate tremor with outstretched hands, DTR normal in all 4  Assessment:  1. Hyperthyroidism - with recent Afib conversion - was on Amiodarone, not after he had the pacemaker placed (06/2011)    Plan:     1. Pt with newly diagnosed hyperthyroidism, I am not sure what type of thyroid disorder he had in the past... He tells me he did not take thyroid hormones in the past, so probably no hypothyroidism. The development of hyperthyroidism might have been the culprit for his new A fib episode. - we discussed about the 3 main causes of hyperthyroidism:  Graves ds.  Toxic Uni/multi-nodular goiter (but thyroid normal appearing, non-nodular, on CT chest from 2011)  Thyroiditis - I will obtain an ESR and Thyroid stimulating antibodies today, to hopefully help with the dx. If TSIs are high >> likely Graves. If ESR high >> likely thyroiditis.  - However, the only way to clearly differentiate between the 3 is by getting a Thyroid Uptake and Scan. - Due to his new A fib episode (with pulse 90-100 despite beta blockade), the priority would be to treat his hyperthyroidism. I will start him on Methimazole 5 mg bid and plan to see him back in 3 weeks to recheck TFTs and likely order the uptake and scan then. He will need to be off MMI for approx. 3-5 days (depending on the severity of the hyperthyroidism) before the scan. But again, I feel that we need to try to control his rate for now and then hopefully he will return to NSR before we attempt to do the Uptake and Scan. - He tells me that he might get cardioverted today (?) but I would not recommend that before TFTs are normal as he has a high  chance to reenter Afib until then - I advised him about SEs of MMI (see pt instructions) and will check a CBC and LFTs today  - given general info about hyperthyroidism - advised to let his Drs at the Coumadin clinic about starting Methimazole as this can influence INR.  - RTC in 3 weeks  . Sodium 06/27/2012 139  135 - 145 mEq/L Final  . Potassium 06/27/2012 4.5  3.5 - 5.1 mEq/L Final  . Chloride 06/27/2012 104  96 - 112 mEq/L Final  . CO2 06/27/2012 29  19 - 32 mEq/L Final  . Glucose, Bld 06/27/2012 163* 70 - 99 mg/dL Final  . BUN 16/03/9603 32* 6 - 23 mg/dL Final  . Creatinine, Ser 06/27/2012 1.7* 0.4 - 1.5 mg/dL Final  . Calcium 54/02/8118 9.1  8.4 - 10.5 mg/dL Final  . GFR 14/78/2956 41.63* >60.00 mL/min Final  Office Visit on 06/27/2012  Component Date Value Range Status  . TSH 06/27/2012 0.10* 0.35 - 5.50 uIU/mL Final  . Free T4 06/27/2012 2.68* 0.60 - 1.60 ng/dL Final  . Sed Rate 21/30/8657 30* 0 - 22 mm/hr Final  . WBC 06/27/2012 5.5  4.5 - 10.5 K/uL Final  . RBC 06/27/2012 4.03* 4.22 - 5.81 Mil/uL Final  . Platelets 06/27/2012 147.0* 150.0 - 400.0 K/uL Final  . Hemoglobin 06/27/2012 12.8* 13.0 - 17.0 g/dL Final  . HCT 84/69/6295 38.2* 39.0 - 52.0 % Final  . MCV 06/27/2012 94.7  78.0 - 100.0 fl Final  . MCHC 06/27/2012 33.5  30.0 - 36.0 g/dL Final  . RDW 28/41/3244 12.9  11.5 - 14.6 % Final  . Total Bilirubin 06/27/2012 0.9  0.3 - 1.2 mg/dL Final  . Bilirubin, Direct 06/27/2012 0.0  0.0 - 0.3 mg/dL Final  . Alkaline Phosphatase 06/27/2012 58  39 - 117  U/L Final  . AST 06/27/2012 17  0 - 37 U/L Final  . ALT 06/27/2012 20  0 - 53 U/L Final  . Total Protein 06/27/2012 7.2  6.0 - 8.3 g/dL Final  . Albumin 16/03/9603 4.1  3.5 - 5.2 g/dL Final   Hyperthyroidism confirmed. Continue MMI. His ESR is a little high, at 30, but a year ago it was 20... Slight anemia and Tc-penia. Called pt to find out if blood in stool, recent colonoscopy, etc. but not available. I see that he is to  have cardioversion in 2 days - will FWD this to Kenneth. Eden Jennings, it would not be unreasonable to repeat his CBC then.  TSI pending.

## 2012-06-27 NOTE — Patient Instructions (Addendum)
Please tell your doctors at the Coumadin clinic that we started Methimazole. Please stop the Methimazole (Tapazole) and call us or your primary care doctor if you develop: - sore throat - fever - yellow skin - dark urine - light colored stools As we will then need to check your blood counts and liver tests. Please take the medication twice a day. Please return to see me in 3 weeks. I will send you the results from today through MyChart. Please call me with any question or concerns: [phone].   Patient information: Hyperthyroidism (overactive thyroid) (The Basics)- Written by the doctors and editors at [employer]   What is hyperthyroidism? - Hyperthyroidism is a condition that can make you feel shaky, anxious, and tired. The most common cause of hyperthyroidism is called Graves' disease.  There is a gland in your neck called the thyroid gland. It makes thyroid hormone. This hormone controls how the body uses and stores energy.  Hyperthyroidism is the medical term for when a person makes too much thyroid hormone. People sometimes confuse this condition with HYPOthyroidism, which is when a person does not make enough thyroid hormone.  What are the symptoms of hyperthyroidism? - Some people with hyperthyroidism have no symptoms. When they do occur, symptoms can include: Anxiety, irritability, or trouble sleeping  Weakness (especially in the arms and thighs, which can make it hard to lift heavy things or climb stairs)  Trembling  Sweating a lot and having trouble dealing with hot weather  Fast or uneven heartbeats  Feeling tired  Weight loss even when you are eating normally  Frequent bowel movements  Hyperthyroidism can also cause a swelling in the neck called a "goiter." If it is caused by Graves' disease, the condition can also make the eyes bulge. Untreated hyperthyroidism can cause a heart rhythm disorder called "atrial fibrillation," chest pain, and rarely, heart failure. In women,  hyperthyroidism can disrupt monthly periods. It can also make it hard to get pregnant. In men, hyperthyroidism can cause the breasts to grow or lead to sexual problems. These problems go away when hyperthyroidism is treated.  Is there a test for hyperthyroidism? - Yes. Your doctor or nurse can test you for hyperthyroidism using a simple blood test. If the blood test indicates a problem, the doctor or nurse might run other tests, too.  How is hyperthyroidism treated? - Hyperthyroidism can be treated with: Medicines - Two types of medicines can be used to treat hyperthyroidism:  Anti-thyroid medicines reduce the amount of hormone your thyroid gland makes.  Beta-blocker medicines help reduce the symptoms of hyperthyroidism. Beta-blockers can make you more comfortable until the thyroid imbalance is under control.  Radioactive iodine - Radioactive iodine comes in a pill or liquid you swallow. It destroys much of the thyroid gland. Pregnant women should not use this treatment, because it can damage the baby's thyroid gland. But the treatment is safe for women who are not pregnant and for men. The amount of radiation used is small. It does not increase the chance of getting cancer, and it does not cause problems getting pregnant in the future or increase the risk of birth defects in future pregnancies.  Surgery - Doctors can do surgery to remove part or all of the thyroid gland. Doctors do not often recommend surgery, because the other treatment choices are safer and less costly. But surgery is the best choice in some cases.  Most people who are treated with radioactive iodine or who have surgery end up making  too little thyroid hormone after treatment. They must take thyroid hormone pills after treatment-for the rest of their life.  What if I want to get pregnant? - If you take anti-thyroid medicine, talk to your doctor or nurse before you start trying to get pregnant. You will probably need to take different  medicines at different times in your pregnancy. Plus, your doses may need to be adjusted.  If you were treated with radioactive iodine, wait at least 6 months before you start trying to get pregnant. This will give your doctor enough time to find out if your thyroid is making enough thyroid hormone after the radioactive iodine treatment. If the radioactive iodine caused the thyroid to make too little thyroid hormone, you will need to take thyroid hormone pills.   This topic retrieved from [employer]Date on: Jun 27, 2012.

## 2012-06-30 ENCOUNTER — Telehealth: Payer: Self-pay | Admitting: Internal Medicine

## 2012-06-30 ENCOUNTER — Telehealth: Payer: Self-pay

## 2012-06-30 DIAGNOSIS — I4891 Unspecified atrial fibrillation: Secondary | ICD-10-CM

## 2012-06-30 MED ORDER — CARVEDILOL 12.5 MG PO TABS: 12.5000 mg | ORAL_TABLET | Freq: Two times a day (BID) | ORAL | Status: AC

## 2012-06-30 NOTE — Telephone Encounter (Signed)
Pt needs carvedilol 12.5 mg was to take double dose, [facility] [address]  [place]

## 2012-06-30 NOTE — Telephone Encounter (Signed)
Called back, but got answering machine. Left VM to check his MyChart and send me a reply with my questions.

## 2012-06-30 NOTE — Telephone Encounter (Signed)
Pt left v-mail stating he was returning your call [phone]

## 2012-07-01 LAB — THYROID STIMULATING IMMUNOGLOBULIN: TSI: 45 % baseline (ref <140)

## 2012-07-02 ENCOUNTER — Ambulatory Visit (HOSPITAL_COMMUNITY)
Admission: RE | Admit: 2012-07-02 | Discharge: 2012-07-02 | Disposition: A | Discharge status: Home / Self Care | Payer: Medicare PPO | Source: Ambulatory Visit | Attending: Cardiovascular Disease | Admitting: Cardiovascular Disease

## 2012-07-02 ENCOUNTER — Encounter (HOSPITAL_COMMUNITY): Payer: Self-pay | Admitting: Anesthesiology

## 2012-07-02 ENCOUNTER — Encounter (HOSPITAL_COMMUNITY): Payer: Self-pay | Admitting: *Deleted

## 2012-07-02 ENCOUNTER — Ambulatory Visit (HOSPITAL_COMMUNITY): Payer: Medicare PPO | Admitting: Anesthesiology

## 2012-07-02 ENCOUNTER — Encounter (HOSPITAL_COMMUNITY)
Admission: RE | Disposition: A | Discharge status: Home / Self Care | Payer: Self-pay | Source: Ambulatory Visit | Attending: Cardiovascular Disease

## 2012-07-02 DIAGNOSIS — I4891 Unspecified atrial fibrillation: Secondary | ICD-10-CM

## 2012-07-02 DIAGNOSIS — E669 Obesity, unspecified: Secondary | ICD-10-CM | POA: Insufficient documentation

## 2012-07-02 DIAGNOSIS — Z8673 Personal history of transient ischemic attack (TIA), and cerebral infarction without residual deficits: Secondary | ICD-10-CM | POA: Insufficient documentation

## 2012-07-02 DIAGNOSIS — I10 Essential (primary) hypertension: Secondary | ICD-10-CM | POA: Insufficient documentation

## 2012-07-02 DIAGNOSIS — E119 Type 2 diabetes mellitus without complications: Secondary | ICD-10-CM | POA: Insufficient documentation

## 2012-07-02 DIAGNOSIS — E7800 Pure hypercholesterolemia, unspecified: Secondary | ICD-10-CM | POA: Insufficient documentation

## 2012-07-02 HISTORY — PX: CARDIOVERSION: SHX1299

## 2012-07-02 SURGERY — CARDIOVERSION
Anesthesia: General

## 2012-07-02 MED ORDER — PROPOFOL 10 MG/ML IV BOLUS
INTRAVENOUS | Status: DC | PRN
  Administered 2012-07-02: 160 mg via INTRAVENOUS

## 2012-07-02 MED ORDER — SODIUM CHLORIDE 0.9 % IV SOLN
INTRAVENOUS | Status: DC
  Administered 2012-07-02: 500 mL via INTRAVENOUS

## 2012-07-02 NOTE — Anesthesia Postprocedure Evaluation (Signed)
  Anesthesia Post-op Note  Patient: Kenneth Jennings  Procedure(s) Performed: Procedure(s) (LRB) with comments: CARDIOVERSION (N/A)  Patient Location: Endoscopy Unit  Anesthesia Type:General  Level of Consciousness: awake, alert  and oriented  Airway and Oxygen Therapy: Patient Spontanous Breathing  Post-op Pain: none  Post-op Assessment: Post-op Vital signs reviewed  Post-op Vital Signs: Reviewed  Complications: No apparent anesthesia complications

## 2012-07-02 NOTE — Transfer of Care (Signed)
Immediate Anesthesia Transfer of Care Note  Patient: Kenneth Jennings  Procedure(s) Performed: Procedure(s) (LRB) with comments: CARDIOVERSION (N/A)  Patient Location: PACU  Anesthesia Type:General  Level of Consciousness: awake, alert , oriented and sedated  Airway & Oxygen Therapy: Patient Spontanous Breathing and Patient connected to nasal cannula oxygen  Post-op Assessment: Report given to PACU RN, Post -op Vital signs reviewed and stable and Patient moving all extremities  Post vital signs: Reviewed and stable  Complications: No apparent anesthesia complications

## 2012-07-02 NOTE — Preoperative (Signed)
Beta Blockers   Reason not to administer Beta Blockers:Not Applicable 

## 2012-07-02 NOTE — CV Procedure (Signed)
DCC:  Initial rhythm afib rate 115 with intermitant pacing.  On Rx coumadin INR 1/17 was 2.6 Anesthesia with   160mg    Of propofol Single 150 J biphasic shock given converted to NSR Pacer interogation ok No immediate neurologic sequelae  Kenneth Jennings

## 2012-07-02 NOTE — H&P (View-Only) (Signed)
[address] , [place]  [address] Phone: [phone], Fax:  [phone]  Date:  06/09/2012   Name:  Kenneth Jennings   DOB:  03/04/1941   MRN:  [id number]  PCP:  GEHRIS,JOHN M., MD  Primary Cardiologist:  Dr. Peter Nishan  Primary Electrophysiologist:  Dr. James Allred    History of Present Illness: Kenneth Jennings is a 71 y.o. male who returns for evaluation of AFib.  He has a history of atrial fibrillation/flutter, HTN, DM2, HL, CKD, carotid stenosis. Patient has undergone left VATS for chronic left pleural effusion 04/2010. Echo 5/11: Mild LVH, EF 65%, moderate to severe LAE, PASP 33. Nuclear study 6/11: No ischemia. Carotid Dopplers 6/13: RICA 40-59%; LICA 60-79%.  He has undergone multiple cardioversions in the past. 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 Allred. He was previously on flecainide but failed this and was changed to amiodarone. He is no longer on amiodarone.  It is not clear to me why this was d/c'd.  He thinks he had side effects to the drug.  Last seen by Dr. Nishan 11/13. There was no evidence of atrial fibrillation on interrogation of his device at that time.  He states he feels like he went back into AFib last week.  He was eating more over the holidays and felt like he had too much salt in his diet.  He notes elevated HRs at times.  He has adjusted his diet back to normal and feels somewhat better.  He does feel fatigued.  He denies chest pain, dyspnea, orthopnea, PND, edema.  No syncope or near syncope.  He has felt dizzy at times.  Labs (11/12):  ALT 22, TSH 5.01 Labs (2/13):    K 3.5, creatinine 1.60, Hgb 14.1   Wt Readings from Last 3 Encounters:  06/09/12 253 lb 9.6 oz (115.032 kg)  04/24/12 246 lb (111.585 kg)  11/15/11 231 lb (104.781 kg)     Past Medical History  Diagnosis Date  . HTN (hypertension)   . Atrial fibrillation     persistent  . Cerebrovascular  accident     10 years ago  . Obesity   . Diabetes mellitus   . Somnolence   . Hypercholesteremia   . Insomnia   . Pleural effusion   . Sleep apnea   . Deviated nasal septum   . Atrial enlargement, left   . Tachycardia-bradycardia     s/p PPM by Dr Allred    Past Surgical History  Procedure Date  . 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 Lung procedure   . Pacemaker insertion 06/22/11    MDT PPM by Dr Allred for tachy/brady syndrome     Current Outpatient Prescriptions  Medication Sig Dispense Refill  . Ascorbic Acid (VITAMIN C) 500 MG tablet Take 500 mg by mouth daily.       . carvedilol (COREG) 6.25 MG tablet Take 6.25 mg by mouth 2 (two) times daily with a meal.      . Cholecalciferol (VITAMIN D PO) Take 1 tablet by mouth daily.      . CINNAMON PO Take 1 tablet by mouth daily.       . Coenzyme Q10 (CO Q 10 PO) Take 1 tablet by mouth daily.        . folic acid (FOLVITE) 800 MCG tablet Take 400 mcg by mouth daily.      . furosemide (LASIX) 40 MG tablet   Take 40 mg by mouth daily.        . insulin glargine (LANTUS SOLOSTAR) 100 UNIT/ML injection Inject 15 Units into the skin at bedtime.       . latanoprost (XALATAN) 0.005 % ophthalmic solution Place 1 drop into both eyes at bedtime.        . losartan (COZAAR) 50 MG tablet Take 1 tablet (50 mg total) by mouth 2 (two) times daily.  180 tablet  3  . MAGNESIUM CARBONATE PO Take 2 tablets by mouth daily.       . Melatonin-Pyridoxine (MELATIN PO) Take 0.5 mg by mouth at bedtime as needed.      . timolol (TIMOPTIC) 0.5 % ophthalmic solution Place 1 drop into the right eye 2 (two) times daily.      . vitamin B-12 (CYANOCOBALAMIN) 50 MCG tablet Take 50 mcg by mouth daily.      . warfarin (COUMADIN) 10 MG tablet Take 10 mg by mouth 3 (three) times a week. As diretced      . clonazePAM (KLONOPIN) 1 MG tablet Take 0.25 mg by mouth at bedtime.       . KRILL OIL PO Take 1 tablet by mouth daily.         Allergies: Allergies    Allergen Reactions  . Benicar (Olmesartan Medoxomil)     Flushing and Headache   . Amlodipine Anxiety    Social History:  The patient  reports that he quit smoking about 13 years ago. He has never used smokeless tobacco. He reports that he drinks alcohol. He reports that he does not use illicit drugs.   ROS:  Please see the history of present illness.    All other systems reviewed and negative.   PHYSICAL EXAM: VS:  BP 130/68  Pulse 88  Ht 6' (1.829 m)  Wt 253 lb 9.6 oz (115.032 kg)  BMI 34.39 kg/m2 Well nourished, well developed, in no acute distress HEENT: normal Neck: no JVD Cardiac:  normal S1, S2; irregularly irregular rhythm; no murmur Lungs:  clear to auscultation bilaterally, no wheezing, rhonchi or rales Abd: soft, nontender, no hepatomegaly Ext: no edema Skin: warm and dry Neuro:  CNs 2-12 intact, no focal abnormalities noted  EKG:  Atrial fibrillation, HR 88, normal axis, nonspecific ST-T wave changes      ASSESSMENT AND PLAN:  1. Atrial Fibrillation:  We interrogated his device today.  He went out of rhythm 12/26.  HRs 80-100 40% of the time.  He does have some HRs 100-120 about 30% of the time.  I will adjust his coreg to 9.75 mg bid for rate control.  Coumadin managed by PCP.  Will contact PCP to get most recent INRs.  He has follow up with Dr. James Allred 1/17 and will keep that appt.  If he continues to have symptoms, will likely need to consider anti-arrhythmic drug Rx again and DCCV.  Given his atrial enlargement, I suspect DCCV alone will not be successful.  I discussed this with the patient today.  We may simply be able to just try amiodarone again.  He is somewhat hesitant regarding this.  He notes cost as an issue.  We also discussed strategy of rate control only (AFFIRM).  Check BMET and TSH today.  Received records from his PCP:  INR (05/30/12):   2.3 INR (05/02/12):   2.8 INR (04/22/12):   3.1 INR (04/03/12:    2.7 INR (03/27/12):   1.8  2.  Hypertension:  Controlled.    3. Chronic Kidney Disease:  Creatinine clearance is calculated > 50.  He asked about Xarelto.  He could take 20 mg QD.  He is not sure if he can afford.  I asked him to look into this.    4. Disposition:  Follow up with Dr. James Allred 06/27/12 as planned.  Signed, Scott Weaver, PA-C  2:15 PM 06/09/2012    

## 2012-07-02 NOTE — Interval H&P Note (Signed)
History and Physical Interval Note:  07/02/2012 12:19 PM  Kenneth Jennings  has presented today for surgery, with the diagnosis of a-fib  The various methods of treatment have been discussed with the patient and family. After consideration of risks, benefits and other options for treatment, the patient has consented to  Procedure(s) (LRB) with comments: CARDIOVERSION (N/A) as a surgical intervention .  The patient's history has been reviewed, patient examined, no change in status, stable for surgery.  I have reviewed the patient's chart and labs.  Questions were answered to the patient's satisfaction.     Charlton Haws

## 2012-07-02 NOTE — Progress Notes (Signed)
SPOKE WITH PT  HAS PMD FOLLOW COUMADIN PER PT HAS APPT NEXT WEEK  TO HAVE INR CHECKED ALSO  HAS APPT  WITH SC[facility]  SCHEDULED FOR 07-25-12 AT 10:30

## 2012-07-02 NOTE — Anesthesia Preprocedure Evaluation (Signed)
Anesthesia Evaluation  Patient identified by MRN, date of birth, ID band Patient awake    Reviewed: Allergy & Precautions, H&P , NPO status , Patient's Chart, lab work & pertinent test results, reviewed documented beta blocker date and time   Airway Mallampati: I TM Distance: >3 FB Neck ROM: Full    Dental  (+) Teeth Intact and Dental Advisory Given   Pulmonary  breath sounds clear to auscultation        Cardiovascular hypertension, Pt. on medications and Pt. on home beta blockers Rhythm:Irregular Rate:Normal     Neuro/Psych    GI/Hepatic   Endo/Other    Renal/GU      Musculoskeletal   Abdominal   Peds  Hematology   Anesthesia Other Findings   Reproductive/Obstetrics                           Anesthesia Physical Anesthesia Plan  ASA: III  Anesthesia Plan: General   Post-op Pain Management:    Induction: Intravenous  Airway Management Planned: Mask  Additional Equipment:   Intra-op Plan:   Post-operative Plan: Extubation in OR  Informed Consent: I have reviewed the patients History and Physical, chart, labs and discussed the procedure including the risks, benefits and alternatives for the proposed anesthesia with the patient or authorized representative who has indicated his/her understanding and acceptance.   Dental advisory given  Plan Discussed with: CRNA and Anesthesiologist  Anesthesia Plan Comments:         Anesthesia Quick Evaluation

## 2012-07-03 ENCOUNTER — Encounter (HOSPITAL_COMMUNITY): Payer: Self-pay | Admitting: Cardiovascular Disease

## 2012-07-06 NOTE — Progress Notes (Signed)
PCP:  Nehemiah Massed., MD Primary Cardiologist:  Dr Eden Emms  The patient presents today for electrophysiology followup.  Over the past few weeks, the patient's exercise tolerance has declined.  He was evaluated by Tereso Newcomer and found to be in afib.  He is referred back to me to consider cardioversion.  He was previously on amiodarone with good control of afib.  This was discontinued for unclear reasons.  He is appropriately anticoagulated with coumadin.  Today, he denies symptoms of palpitations, chest pain,  orthopnea, PND, lower extremity edema, dizziness, presyncope, syncope, or neurologic sequela.  The patient feels that he is tolerating medications without difficulties and is otherwise without complaint today.   Past Medical History  Diagnosis Date  . HTN (hypertension)   . Atrial fibrillation     persistent  . Cerebrovascular accident     10 years ago  . Obesity   . Diabetes mellitus   . Somnolence   . Hypercholesteremia   . Insomnia   . Pleural effusion   . Sleep apnea   . Deviated nasal septum   . Atrial enlargement, left   . Tachycardia-bradycardia     s/p PPM by Dr Johney Frame  . Thyroid disease    Past Surgical History  Procedure Date  . 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 Lung procedure   . Pacemaker insertion 06/22/11    MDT PPM by Dr Johney Frame for tachy/brady syndrome  . Cardioversion 07/02/2012    Procedure: CARDIOVERSION;  Surgeon: Wendall Stade, MD;  [facility] ENDOSCOPY;  Service: Cardiovascular;  Laterality: N/A;    Current Outpatient Prescriptions  Medication Sig Dispense Refill  . Ascorbic Acid (VITAMIN C) 500 MG tablet Take 500 mg by mouth daily.       . Cholecalciferol (VITAMIN D PO) Take 1 tablet by mouth daily.      . Coenzyme Q10 (CO Q 10 PO) Take 1 tablet by mouth daily.        . folic acid (FOLVITE) 800 MCG tablet Take 400 mcg by mouth daily.      . furosemide (LASIX) 40 MG tablet Take 40 mg by mouth daily.        . insulin  glargine (LANTUS SOLOSTAR) 100 UNIT/ML injection Inject 15 Units into the skin at bedtime.       Marland Kitchen latanoprost (XALATAN) 0.005 % ophthalmic solution Place 1 drop into both eyes at bedtime.        Marland Kitchen losartan (COZAAR) 50 MG tablet Take 1 tablet (50 mg total) by mouth 2 (two) times daily.  180 tablet  3  . MAGNESIUM CARBONATE PO Take 2 tablets by mouth daily.       . Melatonin-Pyridoxine (MELATIN PO) Take 0.5 mg by mouth at bedtime as needed.      . methimazole (TAPAZOLE) 5 MG tablet Take 1 tablet (5 mg total) by mouth 2 (two) times daily.  60 tablet  2  . timolol (TIMOPTIC) 0.5 % ophthalmic solution Place 1 drop into the right eye 2 (two) times daily.      . vitamin B-12 (CYANOCOBALAMIN) 50 MCG tablet Take 50 mcg by mouth daily.      Marland Kitchen warfarin (COUMADIN) 10 MG tablet Take 10 mg by mouth 3 (three) times a week. As diretced      . warfarin (COUMADIN) 6 MG tablet Take 6 mg by mouth daily. AS DIRECTED FROM LAB RESULTS      . carvedilol (COREG) 12.5 MG tablet Take  1 tablet (12.5 mg total) by mouth 2 (two) times daily with a meal.  60 tablet  3    Allergies  Allergen Reactions  . Benicar (Olmesartan Medoxomil)     Flushing and Headache   . Amlodipine Anxiety    History   Social History  . Marital Status: Married    Spouse Name: N/A    Number of Children: N/A  . Years of Education: N/A   Occupational History  . Not on file.   Social History Main Topics  . Smoking status: Former Smoker    Quit date: 06/12/1999  . Smokeless tobacco: Never Used  . Alcohol Use: Yes     Comment: rare  . Drug Use: No  . Sexually Active: No   Other Topics Concern  . Not on file   Social History Narrative   RetiredRaising his 2 grandchildren as his son is an Immunologist and his wife commited suicideLikes to golf and watch [place] sports    Family History  Problem Relation Age of Onset  . Coronary artery disease    . Cancer Mother     ROS-  All systems are reviewed and are negative except as  outlined in the HPI above   Physical Exam: Filed Vitals:   06/27/12 1041  BP: 118/62  Pulse: 80  Height: 6\' 1"  (1.854 m)  Weight: 254 lb 12.8 oz (115.577 kg)    GEN- The patient is well appearing, alert and oriented x 3 today.   Head- normocephalic, atraumatic Eyes-  Sclera clear, conjunctiva pink Ears- hearing intact Oropharynx- clear Neck- supple, no JVP Lymph- no cervical lymphadenopathy Lungs- Clear to ausculation bilaterally, normal work of breathing Chest- pacemaker pocket is well healed Heart- irregular rate and rhythm, no murmurs, rubs or gallops, PMI not laterally displaced GI- soft, NT, ND, + BS Extremities- no clubbing, cyanosis, or edema MS- no significant deformity or atrophy Skin- no rash or lesion Psych- euthymic mood, full affect Neuro- strength and sensation are intact  Pacemaker interrogation- reviewed in detail today,  See PACEART report  Assessment and Plan:  1, Afib (persistent) He has returned to afib with symptoms of fatigue.  He was previously on amiodarone but stopped this medicine for unclear reasons. He is appropriately anticoagulated with coumadin.  Today, we discussed cardioversion and anticoagulation at length.  Given renal failure, I do not think that he would be a good candidate for tikosyn.   Amiodarone is probably the best antiarrhythmic option.  Risks, benefits, and alternatives to cardioversion were discussed at length with the patient today.  He wishes to proceed. If he has further afib then we will plan to restart amiodarone at that time.  2. Bradycardia Normal pacemaker function See Pace Art report No changes today  3. HTN Stable No change required today   Return to see Tereso Newcomer in 4 weeks He can then follow-up with Dr Eden Emms afterwards. I will see again for device check in a year.

## 2012-07-14 ENCOUNTER — Ambulatory Visit (INDEPENDENT_AMBULATORY_CARE_PROVIDER_SITE_OTHER): Payer: Medicare PPO | Admitting: *Deleted

## 2012-07-14 ENCOUNTER — Telehealth: Payer: Self-pay | Admitting: Internal Medicine

## 2012-07-14 VITALS — BP 160/70 | HR 111 | Resp 22

## 2012-07-14 DIAGNOSIS — I4891 Unspecified atrial fibrillation: Secondary | ICD-10-CM

## 2012-07-14 MED ORDER — AMIODARONE HCL 200 MG PO TABS: 200.0000 mg | ORAL_TABLET | Freq: Two times a day (BID) | ORAL | Status: DC

## 2012-07-14 NOTE — Telephone Encounter (Signed)
See nurse visit notes from 07/14/2012.

## 2012-07-14 NOTE — Telephone Encounter (Signed)
Pt in a-fib as of 11a, pls advise [phone]

## 2012-07-14 NOTE — Progress Notes (Signed)
Patient here for EKG that shows atrial fib at a rate of 110. Discussed with Dr.Allred and he advised that the patient start Amiodarone 200 mg 2 times per day and follow up with PA on 2/14. Patient also mentioned that his Lasix was cut to 20 mg per day and he is now having some lower extremity edema. Dr.Allred had advised that he needs to go back on lasix 40 mg every day. Patient aware of above.

## 2012-07-14 NOTE — Telephone Encounter (Signed)
Patient states he feels like he is in AFib. BP 183/101, HR 100, no SOB, fatigued, no dizziness, no lightheadedness. Advised and scheduled him to come in this afternoon for an EKG and BP check. Patient agreed to plan.

## 2012-07-15 ENCOUNTER — Telehealth: Payer: Self-pay | Admitting: Internal Medicine

## 2012-07-15 NOTE — Telephone Encounter (Signed)
New problem    Amiodarone 200 mg   [facility] [phone]

## 2012-07-16 ENCOUNTER — Telehealth: Payer: Self-pay | Admitting: Internal Medicine

## 2012-07-16 MED ORDER — AMIODARONE HCL 200 MG PO TABS: 200.0000 mg | ORAL_TABLET | Freq: Two times a day (BID) | ORAL | Status: AC

## 2012-07-16 NOTE — Telephone Encounter (Signed)
Pt has hyperthyroidism and he was advised to start Amiodarone but wanted to check with me first. I advised him to go ahead and start it and we will likely need to increase his MMI dose in the near future. He will come to see me in 2 days.

## 2012-07-16 NOTE — Telephone Encounter (Signed)
The patient called because he is concerned that he was given Amiodarone by Dr. Johney Frame and was told by the pharmacist that he cannot take that medication with a thyroid disorder.  Please call the patient at [phone].

## 2012-07-18 ENCOUNTER — Encounter: Payer: Self-pay | Admitting: Internal Medicine

## 2012-07-18 ENCOUNTER — Ambulatory Visit (INDEPENDENT_AMBULATORY_CARE_PROVIDER_SITE_OTHER): Payer: Medicare PPO | Admitting: Internal Medicine

## 2012-07-18 VITALS — BP 110/58 | HR 91 | Temp 98.8°F | Resp 16 | Ht 72.5 in | Wt 254.0 lb

## 2012-07-18 DIAGNOSIS — E0590 Thyrotoxicosis, unspecified without thyrotoxic crisis or storm: Secondary | ICD-10-CM

## 2012-07-18 LAB — TSH: TSH: 0.11 u[IU]/mL — ABNORMAL LOW (ref 0.35–5.50)

## 2012-07-18 MED ORDER — METHIMAZOLE 10 MG PO TABS: 10.0000 mg | ORAL_TABLET | Freq: Two times a day (BID) | ORAL | Status: DC

## 2012-07-18 NOTE — Patient Instructions (Addendum)
Please return in a month. We might need to increase the methimazole to 10 mg twice a day.

## 2012-07-18 NOTE — Progress Notes (Signed)
Subjective:     Patient ID: Kenneth Jennings, male   DOB: 03-Dec-1940, 72 y.o.   MRN: [id number]  HPI Mr. Sircy returns for followup for his hyperthyroidism. Last visit was ~2.5 weeks ago.  The patient had thyroid function tests performed on 06/18/2012 and a TSH was 0.09. Free T4 was elevated at 2.14 (0.6-1.6) and a free T3 was 3.5 (2.3-4.2). His kidney function was stable with a GFR of 40. On 06/09/2012, he is TSH was 0.05, however, previous values reviewed where the upper limits of normal (ranging from 4-5.2). He was told he had abnormal thyroid fxn in the past, cannot remember when. A CT of his neck (04/12/2010) showed a normal-size thyroid, without nodules.  He was on Amiodarone in the past, but was taken off the med 1 year ago, after he had a pacemaker inserted. He is in Afib since Christmas. Pulse 90-100, despite Coreg increase by Cardiology 1.5 month ago. He has had multiple cardioversions in the past, last was 2 weeks ago >> he stayed in NSR for approx 1 week, now back in Afib- of note, he was hyperthyroid at that time. He was restarted on amiodarone 2 days ago.   No spx related to hyperthyroidism now. No anxiety, depression. No tremors or jitteriness. Please also see ROS section. Last year he lost 35 lbs, gained 30 lbs back.  His latest thyroid labs were: TSH  Date Value Range Status  06/27/2012 0.10* 0.35 - 5.50 uIU/mL Final  06/18/2012 0.09* 0.35 - 5.50 uIU/mL Final  06/09/2012 0.05* 0.35 - 5.50 uIU/mL Final     Free T4  Date Value Range Status  06/27/2012 2.68* 0.60 - 1.60 ng/dL Final  4/0/9811 9.14* 7.82 - 1.60 ng/dL Final  We started Methimazole 5 mg bid at last visit. He tolerates it well.  Last Echo 10/2009: EF 65%, mild LVH. Last myoview 11/2009: no ischemia. Has pacemaker for tachy-brady sd. since 06/2011. He is on Coumadin.  I reviewed pt's medications, allergies, PMH, social hx, family hx and no changes required.  Review of Systems Constitutional: no weight gain  despite decreased appetite, + fatigue, + subjective hypothermia, insomnia Eyes: no blurry vision, no xerophthalmia ENT: no sore throat, no nodules palpated in throat, no dysphagia/odynophagia, no hoarseness Cardiovascular: no CP/SOB/+ palpitations/+ leg swelling Respiratory: no cough/SOB Gastrointestinal: no N/V/D/+ C Musculoskeletal: +  muscle aches, no joint aches Skin: no rashes Neurological: no tremors/numbness/tingling/dizziness, + tinnitus, + HA Psychiatric: no depression/anxiety Low libido, difficulty with erections    I reviewed pt's medications, allergies, PMH, social hx, family hx and no changes required except as below: Amiodarone added to med list.  Objective:   Physical Exam BP 110/58  Pulse 91  Temp 98.8 F (37.1 C) (Oral)  Resp 16  Ht 6' 0.5" (1.842 m)  Wt 254 lb (115.214 kg)  BMI 33.98 kg/m2  SpO2 96% Constitutional: overweight, in NAD Eyes: PERRLA, EOMI, no exophthalmos ENT: moist mucous membranes, no thyromegaly, no cervical lymphadenopathy Cardiovascular: irreg. irreg., No MRG; + bilat. 1+ Pitting leg swelling Respiratory: CTA B Gastrointestinal: abdomen soft, NT, ND, BS+ Musculoskeletal: no deformities, strength intact in all 4 Skin: moist, warm, no rashes Neurological: no tremor with outstretched hands, DTR normal in all 4     Assessment:     1. Hyperthyroidism - on MMI 5 mg bid - in A fib - started on Amiodarone 07/16/2012    Plan:     1. We discussed about the possibility of his hyperthyroidism to have caused this prolonged  A. fib episode, which I believe that this possibility is really high. He was cardioverted 2 weeks ago despite being hyperthyroid, and he did not maintain his normal sinus rhythm for more than a week. He was recently started on amiodarone, which has a very high iodine content, and which would hinder future radioactive iodine ablation attempts. I wonder whether he would benefit from Dronedarone, which does not have iodine but has  similar cardiac benefits as amiodarone. He has a normal ejection fraction. I will discuss with cardiology if this is feasible. The patient tells me that there might be a cost issue, however he can get free medications through the [place] system, so maybe the medication can be prescribed by his doctor at the [facility] under cardiology advice. - For now, we can continue the methimazole, but I would like to check thyroid function tests to see if he needs an increased dose - I will check a TSH, free T3, free T4 and will let him know the results and whether we need to increase the methimazole dose or not - If we end up increasing his methimazole dose, he would need to call the Coumadin clinic and let them know, since his INR can change with improvement of his hyperthyroidism - I will see him back in a month - In the meantime, I encouraged him to let me know If you have any questions or concerns. I appreciated that he called me to let me know about his starting amiodarone.   Office Visit on 07/18/2012  Component Date Value Range Status  . TSH 07/18/2012 0.11* 0.35 - 5.50 uIU/mL Final  . Free T4 07/18/2012 2.45* 0.60 - 1.60 ng/dL Final  . T3, Free 40/98/1191 4.0  2.3 - 4.2 pg/mL Final   Labs not improved >> double MMI dose to 10 mg bid.

## 2012-07-24 ENCOUNTER — Telehealth: Payer: Self-pay | Admitting: Internal Medicine

## 2012-07-24 NOTE — Telephone Encounter (Signed)
New Problem:    Attempted to call patient to reschedule appt on home# due to practice closing for in climate weather and it just rang, cell# did not have VM set up yet.

## 2012-07-25 ENCOUNTER — Ambulatory Visit: Payer: Medicare PPO | Admitting: Physician Assistant

## 2012-07-26 ENCOUNTER — Other Ambulatory Visit: Payer: Self-pay

## 2012-08-15 ENCOUNTER — Ambulatory Visit: Payer: Medicare PPO | Admitting: Internal Medicine

## 2012-08-25 ENCOUNTER — Ambulatory Visit (INDEPENDENT_AMBULATORY_CARE_PROVIDER_SITE_OTHER): Payer: Medicare PPO | Admitting: Internal Medicine

## 2012-08-25 ENCOUNTER — Encounter: Payer: Self-pay | Admitting: Internal Medicine

## 2012-08-25 VITALS — BP 110/60 | HR 89 | Temp 98.1°F | Resp 10 | Wt 259.0 lb

## 2012-08-25 DIAGNOSIS — E0590 Thyrotoxicosis, unspecified without thyrotoxic crisis or storm: Secondary | ICD-10-CM

## 2012-08-25 LAB — T4, FREE: Free T4: 1.19 ng/dL (ref 0.60–1.60)

## 2012-08-25 NOTE — Progress Notes (Signed)
Subjective:     Patient ID: Kenneth Jennings, male   DOB: 02/06/1941, 72 y.o.   MRN: [id number]  HPI Kenneth Jennings returns for followup for his hyperthyroidism. Last visit was ~1 mo ago.  Kenneth Jennings his recurrent atrial fibrillation, and I saw him for several appointments in the last 3 months for his hyperthyroidism. He initially had thyroid function tests performed on 06/09/2012, TSH was 0.05, however, previous values reviewed where the upper limits of normal (ranging from 4-5.2). In 06/18/2012, a TSH was 0.09, free T4 was elevated at 2.14 (0.6-1.6) and a free T3 was 3.5 (2.3-4.2). His kidney function was stable with a GFR of 40.  He was told he had abnormal thyroid fxn in the past, cannot remember when. A CT of his neck (04/12/2010) showed a normal-size thyroid, without nodules. Last Echo 10/2009: EF 65%, mild LVH. Last myoview 11/2009: no ischemia. Has pacemaker for tachy-brady sd. since 06/2011. He is on Coumadin.  He was on Amiodarone in the past, taken off the med 1 year ago, after he had a pacemaker inserted. He was in Afib since Christmas. Pulse 90-100, despite Coreg increase by Cardiology 3 month ago. He has had multiple cardioversions in the past, last was 1.5 mo ago >> he stayed in NSR for approx 1 week, now back in Afib. He was restarted on amiodarone ~6 weeks ago. I initially saw him for hyperthyroidism before starting amiodarone, and we started methimazole 5 mg twice a day. At last visit, which was right after starting amiodarone, we increased his Methimazole from 5 to 10 mg bid. He tolerates it well.   No spx related to hyperthyroidism. No anxiety, depression. No tremors or jitteriness. He has a 5 pound weight gain since last time. His latest thyroid labs were: TSH  Date Value Range Status  07/18/2012 0.11* 0.35 - 5.50 uIU/mL Final  06/27/2012 0.10* 0.35 - 5.50 uIU/mL Final  06/18/2012 0.09* 0.35 - 5.50 uIU/mL Final     Free T4  Date Value Range Status  07/18/2012 2.45* 0.60 - 1.60 ng/dL  Final  09/17/8117 1.47* 0.60 - 1.60 ng/dL Final  01/10/9561 1.30* 8.65 - 1.60 ng/dL Final   I reviewed pt's medications, allergies, PMH, social hx, family hx and no changes required.  Review of Systems Constitutional: + weight gain, no fatigue, no subjective hypothermia, insomnia Eyes: no blurry vision, no xerophthalmia ENT: no sore throat, no nodules palpated in throat, no dysphagia/odynophagia, no hoarseness Cardiovascular: no CP/SOB/no palpitations/+ leg swelling Respiratory: no cough/SOB Gastrointestinal: no N/V/D/+ C Musculoskeletal: +  muscle aches, no joint aches Skin: no rashes Neurological: no tremors/numbness/tingling/dizziness, + tinnitus Psychiatric: no depression/anxiety Low libido, difficulty with erections    I reviewed pt's medications, allergies, PMH, social hx, family hx and no changes required.  Objective:   Physical Exam BP 110/60  Pulse 89  Temp(Src) 98.1 F (36.7 C) (Oral)  Resp 10  Wt 259 lb (117.482 kg)  BMI 34.63 kg/m2  SpO2 95% Wt Readings from Last 3 Encounters:  08/25/12 259 lb (117.482 kg)  07/18/12 254 lb (115.214 kg)  06/27/12 254 lb 12.8 oz (115.577 kg)  Constitutional: overweight, in NAD Eyes: PERRLA, EOMI, no exophthalmos ENT: moist mucous membranes, no thyromegaly, no cervical lymphadenopathy Cardiovascular: RRR No MRG; + bilat. 1+ Pitting leg swelling Respiratory: CTA B Gastrointestinal: abdomen soft, NT, ND, BS+ Musculoskeletal: no deformities, strength intact in all 4 Skin: moist, warm, no rashes Neurological: no tremor with outstretched hands, DTR normal in all 4  Assessment:     1. Hyperthyroidism - on MMI 10 mg bid - A fib >> NSR now - started on Amiodarone 07/16/2012    Plan:     1.  Pt with hyperthyroidism, now on amiodarone treatment for his recurrent atrial fibrillation. Patient is doing much better, and is in sinus rhythm at today's appointment. His blood pressure and pulse are normal. He has gained 5 pounds, which  is a good indication that his hyperthyroidism status became more controlled. He does not appear to have any side effects from the methimazole. - We will continue the methimazole at 10 mg by mouth twice a day for now - I will check a TSH, free T3, free T4 and will let him know the results and whether we need to increase the methimazole dose or not - If we end up increasing his methimazole dose, he would need to call the Coumadin clinic and let them know, since his INR can change with improvement of his hyperthyroidism - we reviewed together indications to call us when on methimazole (fever, sore throat, abdominal pain, dark urine, light stools, increased fatigue) - I will see him back in 3 months, but I need to return sooner than that for labs - In the meantime, I encouraged him to let me know If you have any questions or concerns.   Office Visit on 08/25/2012  Component Date Value Range Status  . Free T4 08/25/2012 1.19  0.60 - 1.60 ng/dL Final  . T3, Free 08/65/7846 2.5  2.3 - 4.2 pg/mL Final  . TSH 08/25/2012 0.49  0.35 - 5.50 uIU/mL Final  Thyroid hormones normalized, for now continue the same dose of methimazole, and return in 4-6 weeks for repeat thyroid function tests. I will send the patient a message through my chart.

## 2012-08-25 NOTE — Patient Instructions (Addendum)
We will check labs today and I will send them to you through MyChart. Please return in 3 months for an appointment.

## 2012-09-29 ENCOUNTER — Encounter: Payer: Self-pay | Admitting: Internal Medicine

## 2012-09-29 ENCOUNTER — Other Ambulatory Visit: Payer: Self-pay | Admitting: Internal Medicine

## 2012-09-29 ENCOUNTER — Ambulatory Visit (INDEPENDENT_AMBULATORY_CARE_PROVIDER_SITE_OTHER): Payer: Medicare PPO | Admitting: *Deleted

## 2012-09-29 DIAGNOSIS — I495 Sick sinus syndrome: Secondary | ICD-10-CM

## 2012-09-29 DIAGNOSIS — Z950 Presence of cardiac pacemaker: Secondary | ICD-10-CM

## 2012-10-12 LAB — REMOTE PACEMAKER DEVICE
AL IMPEDENCE PM: 439 Ohm
AL THRESHOLD: 0.5 V
ATRIAL PACING PM: 78
BAMS-0001: 150 {beats}/min
RV LEAD AMPLITUDE: 22.4 mv
RV LEAD THRESHOLD: 2.5 V

## 2012-10-16 ENCOUNTER — Other Ambulatory Visit: Payer: Self-pay | Admitting: Internal Medicine

## 2012-10-16 NOTE — Telephone Encounter (Signed)
Carollee Herter, Can you please try to call pt to come for labs before further methimazole refills... Lab orders are in. Thanks!

## 2012-10-18 ENCOUNTER — Other Ambulatory Visit: Payer: Self-pay | Admitting: Internal Medicine

## 2012-10-20 ENCOUNTER — Other Ambulatory Visit: Payer: Self-pay | Admitting: Internal Medicine

## 2012-10-20 ENCOUNTER — Encounter: Payer: Self-pay | Admitting: *Deleted

## 2012-10-20 ENCOUNTER — Telehealth: Payer: Self-pay | Admitting: *Deleted

## 2012-10-20 NOTE — Telephone Encounter (Signed)
Called pt and let him know that he needs to come in ASAP to get his lab work done. Advised him that the lab is in our office and to just come here. Pt understood.

## 2012-10-20 NOTE — Telephone Encounter (Signed)
Kenneth Jennings, Can you plz call pt to come for labs ASAP?  - they are entered. I don't know if you already called him for this.Marland KitchenMarland Kitchen

## 2012-10-23 ENCOUNTER — Ambulatory Visit (INDEPENDENT_AMBULATORY_CARE_PROVIDER_SITE_OTHER): Payer: Medicare PPO

## 2012-10-23 DIAGNOSIS — E0590 Thyrotoxicosis, unspecified without thyrotoxic crisis or storm: Secondary | ICD-10-CM

## 2012-10-23 LAB — TSH: TSH: 28.25 u[IU]/mL — ABNORMAL HIGH (ref 0.35–5.50)

## 2012-10-23 LAB — T3, FREE: T3, Free: 2 pg/mL — ABNORMAL LOW (ref 2.3–4.2)

## 2012-10-24 ENCOUNTER — Other Ambulatory Visit: Payer: Self-pay | Admitting: Internal Medicine

## 2012-10-24 DIAGNOSIS — E0590 Thyrotoxicosis, unspecified without thyrotoxic crisis or storm: Secondary | ICD-10-CM

## 2012-10-24 MED ORDER — METHIMAZOLE 10 MG PO TABS: 5.0000 mg | ORAL_TABLET | Freq: Every day | ORAL | Status: AC

## 2012-10-24 NOTE — Progress Notes (Signed)
I received a refill request on patient's methimazole from the pharmacy, and, reviewing his chart, he has not returned for labs 4-6 weeks after our last appointment, as advised. We are now 2 months after our last appointment, and I asked the patient to come back for repeat thyroid labs. He is still on methimazole 10 mg twice a day.  Appointment on 10/23/2012  Component Date Value Range Status  . TSH 10/23/2012 28.25* 0.35 - 5.50 uIU/mL Final  . Free T4 10/23/2012 0.77  0.60 - 1.60 ng/dL Final  . T3, Free 16/03/9603 2.0* 2.3 - 4.2 pg/mL Final   I called the patient and we discussed about the labs, and I called him to decrease the methimazole to 5 mg daily. He would need to return in 3-4 weeks for labs. He understood. I also sent him the results for my chart.

## 2012-11-17 ENCOUNTER — Telehealth: Payer: Self-pay | Admitting: Internal Medicine

## 2012-11-17 NOTE — Telephone Encounter (Signed)
New problem   Pt wanted dr allred to know that he's moving and wants to know if he needs to get in to see him before he moves

## 2012-11-20 NOTE — Telephone Encounter (Signed)
He should be ok to establish once he moves.

## 2012-11-24 ENCOUNTER — Encounter (INDEPENDENT_AMBULATORY_CARE_PROVIDER_SITE_OTHER): Payer: Medicare PPO

## 2012-11-24 ENCOUNTER — Other Ambulatory Visit (INDEPENDENT_AMBULATORY_CARE_PROVIDER_SITE_OTHER): Payer: Medicare PPO

## 2012-11-24 DIAGNOSIS — R42 Dizziness and giddiness: Secondary | ICD-10-CM

## 2012-11-24 DIAGNOSIS — E0590 Thyrotoxicosis, unspecified without thyrotoxic crisis or storm: Secondary | ICD-10-CM

## 2012-11-24 DIAGNOSIS — I6529 Occlusion and stenosis of unspecified carotid artery: Secondary | ICD-10-CM

## 2012-11-24 LAB — T3, FREE: T3, Free: 2.5 pg/mL (ref 2.3–4.2)

## 2012-11-24 NOTE — Telephone Encounter (Signed)
lmom for patient to let me know and I would help get his records transfered

## 2012-11-25 ENCOUNTER — Telehealth: Payer: Self-pay | Admitting: *Deleted

## 2012-11-25 ENCOUNTER — Encounter: Payer: Self-pay | Admitting: Internal Medicine

## 2012-11-25 NOTE — Telephone Encounter (Signed)
Faxed lab orders to pt #[phone] per Dr Elvera Lennox.

## 2012-11-26 NOTE — Progress Notes (Unsigned)
This encounter was created in error - please disregard.

## 2012-12-04 ENCOUNTER — Ambulatory Visit: Payer: Medicare PPO | Admitting: Internal Medicine

## 2013-01-05 ENCOUNTER — Encounter: Payer: Medicare PPO | Admitting: *Deleted

## 2013-01-13 ENCOUNTER — Encounter: Payer: Self-pay | Admitting: *Deleted

## 2013-01-14 ENCOUNTER — Other Ambulatory Visit: Payer: Self-pay

## 2013-04-16 ENCOUNTER — Other Ambulatory Visit: Payer: Self-pay

## 2013-07-01 ENCOUNTER — Encounter: Payer: Self-pay | Admitting: *Deleted

## 2013-07-15 ENCOUNTER — Encounter: Payer: Self-pay | Admitting: *Deleted

## 2013-08-05 ENCOUNTER — Other Ambulatory Visit: Payer: Self-pay | Admitting: Internal Medicine

## 2013-08-05 NOTE — Telephone Encounter (Signed)
Is this ok to refill? Just asking to be sure since it has been a while since he has been seen and I saw that he has moved. Please advise. Thanks, MI

## 2013-11-19 ENCOUNTER — Encounter: Payer: Self-pay | Admitting: *Deleted

## 2013-11-19 ENCOUNTER — Telehealth: Payer: Self-pay | Admitting: Cardiology

## 2013-11-19 NOTE — Telephone Encounter (Signed)
Certified letter mailed.

## 2013-12-24 NOTE — Telephone Encounter (Signed)
Close encounter 

## 2014-05-20 ENCOUNTER — Encounter (HOSPITAL_COMMUNITY): Payer: Self-pay | Admitting: Internal Medicine
